# Patient Record
Sex: Male | Born: 1955 | Race: White | Hispanic: No | Marital: Married | Smoking: Never smoker
Health system: Southern US, Community
[De-identification: ages and names within clinical notes are randomized; demographics above are authoritative.]

## PROBLEM LIST (undated history)

## (undated) DIAGNOSIS — K219 Gastro-esophageal reflux disease without esophagitis: Secondary | ICD-10-CM

## (undated) DIAGNOSIS — Z860100 Personal history of colon polyps, unspecified: Secondary | ICD-10-CM

## (undated) DIAGNOSIS — N4231 Prostatic intraepithelial neoplasia: Secondary | ICD-10-CM

## (undated) DIAGNOSIS — M1990 Unspecified osteoarthritis, unspecified site: Secondary | ICD-10-CM

## (undated) DIAGNOSIS — N529 Male erectile dysfunction, unspecified: Secondary | ICD-10-CM

## (undated) DIAGNOSIS — J189 Pneumonia, unspecified organism: Secondary | ICD-10-CM

## (undated) DIAGNOSIS — N63 Unspecified lump in unspecified breast: Secondary | ICD-10-CM

## (undated) DIAGNOSIS — Z800 Family history of malignant neoplasm of digestive organs: Secondary | ICD-10-CM

## (undated) HISTORY — DX: Unspecified osteoarthritis, unspecified site: M19.90

## (undated) HISTORY — DX: Personal history of colonic polyps: Z86.010

## (undated) HISTORY — DX: Family history of malignant neoplasm of digestive organs: Z80.0

## (undated) HISTORY — PX: HERNIA REPAIR: SHX51

## (undated) HISTORY — DX: Unspecified lump in unspecified breast: N63.0

## (undated) HISTORY — DX: Prostatic intraepithelial neoplasia: N42.31

## (undated) HISTORY — DX: Personal history of colon polyps, unspecified: Z86.0100

## (undated) HISTORY — DX: Gastro-esophageal reflux disease without esophagitis: K21.9

## (undated) HISTORY — DX: Male erectile dysfunction, unspecified: N52.9

## (undated) HISTORY — DX: Pneumonia, unspecified organism: J18.9

---
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
## 2000-10-22 HISTORY — PX: HAND SURGERY: SHX662

## 2006-10-22 LAB — HM COLONOSCOPY

## 2009-01-26 DIAGNOSIS — E039 Hypothyroidism, unspecified: Secondary | ICD-10-CM

## 2009-01-26 DIAGNOSIS — E7800 Pure hypercholesterolemia, unspecified: Secondary | ICD-10-CM

## 2009-01-26 DIAGNOSIS — J309 Allergic rhinitis, unspecified: Secondary | ICD-10-CM

## 2009-01-26 DIAGNOSIS — K219 Gastro-esophageal reflux disease without esophagitis: Secondary | ICD-10-CM

## 2009-01-26 DIAGNOSIS — Z86010 Personal history of colonic polyps: Secondary | ICD-10-CM | POA: Insufficient documentation

## 2009-01-26 DIAGNOSIS — K449 Diaphragmatic hernia without obstruction or gangrene: Secondary | ICD-10-CM

## 2009-01-26 DIAGNOSIS — Z860101 Personal history of adenomatous and serrated colon polyps: Secondary | ICD-10-CM | POA: Insufficient documentation

## 2009-10-22 HISTORY — PX: UMBILICAL HERNIA REPAIR: SHX196

## 2010-02-24 ENCOUNTER — Ambulatory Visit: Payer: Self-pay | Admitting: General Surgery

## 2010-04-12 ENCOUNTER — Ambulatory Visit: Payer: Self-pay | Admitting: Gastroenterology

## 2010-04-12 LAB — HM COLONOSCOPY

## 2010-04-26 DIAGNOSIS — E559 Vitamin D deficiency, unspecified: Secondary | ICD-10-CM | POA: Insufficient documentation

## 2011-03-27 ENCOUNTER — Ambulatory Visit: Payer: Self-pay | Admitting: Family Medicine

## 2012-01-21 ENCOUNTER — Ambulatory Visit: Payer: Self-pay | Admitting: Family Medicine

## 2012-01-21 LAB — PSA: PSA: 2.2

## 2012-03-24 ENCOUNTER — Ambulatory Visit: Payer: Self-pay | Admitting: Family Medicine

## 2012-03-28 ENCOUNTER — Ambulatory Visit: Payer: Self-pay | Admitting: Family Medicine

## 2013-04-02 LAB — LIPID PANEL
CHOLESTEROL: 191 mg/dL (ref 0–200)
HDL: 55 mg/dL (ref 35–70)
LDL Cholesterol: 110 mg/dL
Triglycerides: 129 mg/dL (ref 40–160)

## 2013-04-02 LAB — BASIC METABOLIC PANEL
BUN: 15 mg/dL (ref 4–21)
Creatinine: 1 mg/dL (ref 0.6–1.3)
GLUCOSE: 99 mg/dL
POTASSIUM: 5.2 mmol/L (ref 3.4–5.3)
SODIUM: 142 mmol/L (ref 137–147)

## 2013-04-02 LAB — VITAMIN D 25 HYDROXY (VIT D DEFICIENCY, FRACTURES)
CK TOTAL: 133 U/L (ref ?–195.0)
Vitamin D 1, 25 (OH)2 Total: 33.1

## 2013-04-02 LAB — CBC AND DIFFERENTIAL
HCT: 47 % (ref 41–53)
Hemoglobin: 15.7 g/dL (ref 13.5–17.5)
PLATELETS: 300 10*3/uL (ref 150–399)
WBC: 6.3 10^3/mL

## 2013-04-02 LAB — TSH: TSH: 2.1 u[IU]/mL (ref 0.41–5.90)

## 2014-04-27 LAB — VITAMIN D 25 HYDROXY (VIT D DEFICIENCY, FRACTURES): Vitamin D 1, 25 (OH)2 Total: 35.5

## 2014-04-27 LAB — LIPID PANEL
Cholesterol: 186 mg/dL (ref 0–200)
HDL: 49 mg/dL (ref 35–70)
LDL Cholesterol: 103 mg/dL
Triglycerides: 172 mg/dL — AB (ref 40–160)

## 2014-04-27 LAB — BASIC METABOLIC PANEL
BUN: 13 mg/dL (ref 4–21)
Creatinine: 1 mg/dL (ref 0.6–1.3)
Glucose: 96 mg/dL
Potassium: 5.2 mmol/L (ref 3.4–5.3)
Sodium: 139 mmol/L (ref 137–147)

## 2014-04-27 LAB — TSH: TSH: 0.89 u[IU]/mL (ref 0.41–5.90)

## 2015-03-02 ENCOUNTER — Other Ambulatory Visit: Payer: Self-pay | Admitting: *Deleted

## 2015-03-02 ENCOUNTER — Encounter: Payer: Self-pay | Admitting: *Deleted

## 2015-03-02 DIAGNOSIS — Z1330 Encounter for screening examination for mental health and behavioral disorders, unspecified: Secondary | ICD-10-CM | POA: Insufficient documentation

## 2015-03-02 DIAGNOSIS — Z1342 Encounter for screening for global developmental delays (milestones): Secondary | ICD-10-CM | POA: Insufficient documentation

## 2015-03-02 DIAGNOSIS — J189 Pneumonia, unspecified organism: Secondary | ICD-10-CM

## 2015-03-02 DIAGNOSIS — IMO0001 Reserved for inherently not codable concepts without codable children: Secondary | ICD-10-CM | POA: Insufficient documentation

## 2015-03-02 DIAGNOSIS — R252 Cramp and spasm: Secondary | ICD-10-CM | POA: Insufficient documentation

## 2015-03-02 DIAGNOSIS — N4231 Prostatic intraepithelial neoplasia: Secondary | ICD-10-CM | POA: Insufficient documentation

## 2015-03-02 DIAGNOSIS — R030 Elevated blood-pressure reading, without diagnosis of hypertension: Secondary | ICD-10-CM

## 2015-03-02 DIAGNOSIS — Z800 Family history of malignant neoplasm of digestive organs: Secondary | ICD-10-CM | POA: Insufficient documentation

## 2015-03-02 DIAGNOSIS — N529 Male erectile dysfunction, unspecified: Secondary | ICD-10-CM | POA: Insufficient documentation

## 2015-03-02 DIAGNOSIS — N63 Unspecified lump in unspecified breast: Secondary | ICD-10-CM | POA: Insufficient documentation

## 2015-03-02 HISTORY — DX: Pneumonia, unspecified organism: J18.9

## 2015-03-03 ENCOUNTER — Encounter: Payer: Self-pay | Admitting: *Deleted

## 2015-03-04 ENCOUNTER — Encounter: Payer: Self-pay | Admitting: Family Medicine

## 2015-04-13 ENCOUNTER — Encounter: Payer: Self-pay | Admitting: Family Medicine

## 2015-04-13 ENCOUNTER — Ambulatory Visit (INDEPENDENT_AMBULATORY_CARE_PROVIDER_SITE_OTHER): Payer: 59 | Admitting: Family Medicine

## 2015-04-13 VITALS — BP 112/74 | HR 93 | Temp 98.0°F | Resp 16 | Ht 74.0 in | Wt 243.0 lb

## 2015-04-13 DIAGNOSIS — E559 Vitamin D deficiency, unspecified: Secondary | ICD-10-CM

## 2015-04-13 DIAGNOSIS — Z860100 Personal history of colon polyps, unspecified: Secondary | ICD-10-CM

## 2015-04-13 DIAGNOSIS — E7800 Pure hypercholesterolemia, unspecified: Secondary | ICD-10-CM

## 2015-04-13 DIAGNOSIS — Z0000 Encounter for general adult medical examination without abnormal findings: Secondary | ICD-10-CM

## 2015-04-13 DIAGNOSIS — E039 Hypothyroidism, unspecified: Secondary | ICD-10-CM | POA: Diagnosis not present

## 2015-04-13 DIAGNOSIS — H109 Unspecified conjunctivitis: Secondary | ICD-10-CM

## 2015-04-13 MED ORDER — CIPROFLOXACIN HCL 0.3 % OP SOLN: 2.0000 [drp] | OPHTHALMIC | Status: AC
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
## 2015-04-27 ENCOUNTER — Telehealth: Payer: Self-pay

## 2015-04-27 ENCOUNTER — Other Ambulatory Visit: Payer: Self-pay

## 2015-04-27 DIAGNOSIS — E7800 Pure hypercholesterolemia, unspecified: Secondary | ICD-10-CM

## 2015-04-27 LAB — LIPID PANEL
Chol/HDL Ratio: 3.8 ratio units (ref 0.0–5.0)
Cholesterol, Total: 206 mg/dL — ABNORMAL HIGH (ref 100–199)
HDL: 54 mg/dL (ref >39)
LDL Calculated: 114 mg/dL — ABNORMAL HIGH (ref 0–99)
Triglycerides: 190 mg/dL — ABNORMAL HIGH (ref 0–149)
VLDL Cholesterol Cal: 38 mg/dL (ref 5–40)

## 2015-04-27 LAB — COMPREHENSIVE METABOLIC PANEL
ALT: 20 IU/L (ref 0–44)
AST: 23 IU/L (ref 0–40)
Albumin/Globulin Ratio: 2.1 (ref 1.1–2.5)
Albumin: 4.4 g/dL (ref 3.5–5.5)
Alkaline Phosphatase: 48 IU/L (ref 39–117)
BUN/Creatinine Ratio: 13 (ref 9–20)
BUN: 14 mg/dL (ref 6–24)
Bilirubin Total: 0.3 mg/dL (ref 0.0–1.2)
CALCIUM: 9.3 mg/dL (ref 8.7–10.2)
CHLORIDE: 101 mmol/L (ref 97–108)
CO2: 25 mmol/L (ref 18–29)
Creatinine, Ser: 1.08 mg/dL (ref 0.76–1.27)
GFR calc Af Amer: 86 mL/min/{1.73_m2} (ref >59)
GFR, EST NON AFRICAN AMERICAN: 75 mL/min/{1.73_m2} (ref >59)
GLUCOSE: 100 mg/dL — AB (ref 65–99)
Globulin, Total: 2.1 g/dL (ref 1.5–4.5)
Potassium: 4.6 mmol/L (ref 3.5–5.2)
SODIUM: 140 mmol/L (ref 134–144)
Total Protein: 6.5 g/dL (ref 6.0–8.5)

## 2015-04-27 LAB — PSA: Prostate Specific Ag, Serum: 1.9 ng/mL (ref 0.0–4.0)

## 2015-04-27 LAB — TSH: TSH: 0.77 u[IU]/mL (ref 0.450–4.500)

## 2015-04-27 LAB — VITAMIN D 25 HYDROXY (VIT D DEFICIENCY, FRACTURES): VIT D 25 HYDROXY: 32.3 ng/mL (ref 30.0–100.0)

## 2015-04-27 MED ORDER — PRAVASTATIN SODIUM 80 MG PO TABS: 80.0000 mg | ORAL_TABLET | Freq: Every day | ORAL | Status: DC

## 2015-04-27 NOTE — Telephone Encounter (Signed)
Patient advised of lab results. Patient states he needs a refill on Pravastatin. Refill Request has been sent  And is attached in this message.

## 2015-04-27 NOTE — Telephone Encounter (Signed)
Gastroenterology Pre-Procedure Review  Request Date: 06-21-15 Requesting Physician: Dr. Caryn Section  PATIENT REVIEW QUESTIONS: The patient responded to the following health history questions as indicated:    1. Are you having any GI issues? no 2. Do you have a personal history of Polyps? yes (last one 5 years ago didn't removed. Some in the past) 3. Do you have a family history of Colon Cancer or Polyps? yes (Paternal grandmother) 36. Diabetes Mellitus? no 5. Joint replacements in the past 12 months?no 6. Major health problems in the past 3 months?no 7. Any artificial heart valves, MVP, or defibrillator?no    MEDICATIONS & ALLERGIES:    Patient reports the following regarding taking any anticoagulation/antiplatelet therapy:   Plavix, Coumadin, Eliquis, Xarelto, Lovenox, Pradaxa, Brilinta, or Effient? no Aspirin? yes (81mg )  Patient confirms/reports the following medications:  Current Outpatient Prescriptions  Medication Sig Dispense Refill  . aspirin 81 MG tablet Take 1 tablet by mouth daily.    . cholecalciferol (VITAMIN D) 1000 UNITS tablet Take 1 tablet by mouth daily.    . fexofenadine (ALLEGRA) 180 MG tablet Take 1 tablet by mouth daily.    . finasteride (PROSCAR) 5 MG tablet Take 1 tablet by mouth daily.    Marland Kitchen levothyroxine (SYNTHROID, LEVOTHROID) 200 MCG tablet Take 1 tablet by mouth daily.    . pantoprazole (PROTONIX) 40 MG tablet Take 1 tablet by mouth daily.    . pravastatin (PRAVACHOL) 80 MG tablet Take 1 tablet by mouth daily.    . sildenafil (VIAGRA) 100 MG tablet Take 1 tablet by mouth daily as needed.    . triamcinolone (NASACORT) 55 MCG/ACT AERO nasal inhaler Place 1 spray into the nose as needed. Each nostril     No current facility-administered medications for this visit.    Patient confirms/reports the following allergies:  No Known Allergies  No orders of the defined types were placed in this encounter.    AUTHORIZATION INFORMATION Primary  Insurance: 1D#: Group #:  Secondary Insurance: 1D#: Group #:  SCHEDULE INFORMATION: Date: 06-21-15 Time: Location: [facility]

## 2015-04-27 NOTE — Telephone Encounter (Signed)
-----   Message from Birdie Sons, MD sent at 04/27/2015  1:34 PM EDT ----- Labs are all good. Continue current medications.

## 2015-06-21 ENCOUNTER — Ambulatory Visit: Payer: 59 | Admitting: Anesthesiology

## 2015-06-21 ENCOUNTER — Encounter: Payer: Self-pay | Admitting: *Deleted

## 2015-06-21 ENCOUNTER — Ambulatory Visit
Admission: RE | Admit: 2015-06-21 | Discharge: 2015-06-21 | Disposition: A | Discharge status: Home / Self Care | Payer: 59 | Source: Ambulatory Visit | Attending: Gastroenterology | Admitting: Gastroenterology

## 2015-06-21 ENCOUNTER — Encounter
Admission: RE | Disposition: A | Discharge status: Home / Self Care | Payer: Self-pay | Source: Ambulatory Visit | Attending: Gastroenterology

## 2015-06-21 DIAGNOSIS — Z86010 Personal history of colonic polyps: Secondary | ICD-10-CM | POA: Insufficient documentation

## 2015-06-21 DIAGNOSIS — Z7982 Long term (current) use of aspirin: Secondary | ICD-10-CM | POA: Insufficient documentation

## 2015-06-21 DIAGNOSIS — Z79899 Other long term (current) drug therapy: Secondary | ICD-10-CM | POA: Diagnosis not present

## 2015-06-21 DIAGNOSIS — E039 Hypothyroidism, unspecified: Secondary | ICD-10-CM | POA: Insufficient documentation

## 2015-06-21 DIAGNOSIS — D123 Benign neoplasm of transverse colon: Secondary | ICD-10-CM | POA: Diagnosis not present

## 2015-06-21 DIAGNOSIS — D120 Benign neoplasm of cecum: Secondary | ICD-10-CM | POA: Insufficient documentation

## 2015-06-21 DIAGNOSIS — D122 Benign neoplasm of ascending colon: Secondary | ICD-10-CM | POA: Diagnosis not present

## 2015-06-21 DIAGNOSIS — N529 Male erectile dysfunction, unspecified: Secondary | ICD-10-CM | POA: Insufficient documentation

## 2015-06-21 HISTORY — PX: COLONOSCOPY WITH PROPOFOL: SHX5780

## 2015-06-21 SURGERY — COLONOSCOPY WITH PROPOFOL
Anesthesia: General

## 2015-06-21 MED ORDER — PHENYLEPHRINE HCL 10 MG/ML IJ SOLN
INTRAMUSCULAR | Status: DC | PRN
  Administered 2015-06-21: 100 ug via INTRAVENOUS

## 2015-06-21 MED ORDER — LIDOCAINE HCL (CARDIAC) 20 MG/ML IV SOLN
INTRAVENOUS | Status: DC | PRN
  Administered 2015-06-21: 60 mg via INTRAVENOUS

## 2015-06-21 MED ORDER — PROPOFOL 10 MG/ML IV BOLUS
INTRAVENOUS | Status: DC | PRN
  Administered 2015-06-21: 70 mg via INTRAVENOUS

## 2015-06-21 MED ORDER — PROPOFOL INFUSION 10 MG/ML OPTIME
INTRAVENOUS | Status: DC | PRN
  Administered 2015-06-21: 200 ug/kg/min via INTRAVENOUS

## 2015-06-21 MED ORDER — SODIUM CHLORIDE 0.9 % IV SOLN
INTRAVENOUS | Status: DC
  Administered 2015-06-21: 09:00:00 via INTRAVENOUS

## 2015-06-21 NOTE — Anesthesia Postprocedure Evaluation (Signed)
  Anesthesia Post-op Note  Patient: Ronnie [name]  Procedure(s) Performed: Procedure(s): COLONOSCOPY WITH PROPOFOL (N/A)  Anesthesia type:General  Patient location: PACU  Post pain: Pain level controlled  Post assessment: Post-op Vital signs reviewed, Patient's Cardiovascular Status Stable, Respiratory Function Stable, Patent Airway and No signs of Nausea or vomiting  Post vital signs: Reviewed and stable  Last Vitals:  Filed Vitals:   06/21/15 1019  BP: 98/56  Pulse: 68  Temp: 36.3 C  Resp: 15    Level of consciousness: awake, alert  and patient cooperative  Complications: No apparent anesthesia complications

## 2015-06-21 NOTE — Op Note (Signed)
[facility] Patient Name: Ronnie [name] Procedure Date: 06/21/2015 9:46 AM MRN: [id number] Account #: [id number] Date of Birth: 04/28/1956 Admit Type: Inpatient Age: 59 Room: [facility] ENDO ROOM 4 Gender: Male Note Status: Finalized Procedure:         Colonoscopy Indications:       High risk colon cancer surveillance: Personal history of                     colonic polyps Providers:         Lucilla Lame, MD Referring MD:      Kirstie Peri. Caryn Section, MD (Referring MD) Medicines:         Propofol per Anesthesia Complications:     No immediate complications. Procedure:         Pre-Anesthesia Assessment:                    - Prior to the procedure, a History and Physical was                     performed, and patient medications and allergies were                     reviewed. The patient's tolerance of previous anesthesia                     was also reviewed. The risks and benefits of the procedure                     and the sedation options and risks were discussed with the                     patient. All questions were answered, and informed consent                     was obtained. Prior Anticoagulants: The patient has taken                     no previous anticoagulant or antiplatelet agents. ASA                     Grade Assessment: II - A patient with mild systemic                     disease. After reviewing the risks and benefits, the                     patient was deemed in satisfactory condition to undergo                     the procedure.                    After obtaining informed consent, the colonoscope was                     passed under direct vision. Throughout the procedure, the                     patient's blood pressure, pulse, and oxygen saturations                     were monitored continuously. The Colonoscope was  introduced through the anus and advanced to the the cecum,                     identified by  appendiceal orifice and ileocecal valve. The                     colonoscopy was performed without difficulty. The patient                     tolerated the procedure well. The quality of the bowel                     preparation was excellent. Findings:      The perianal and digital rectal examinations were normal.      Two sessile polyps were found in the cecum. The polyps were 4 to 6 mm in       size. These polyps were removed with a cold snare. Resection and       retrieval were complete.      Three sessile polyps were found in the ascending colon. The polyps were       4 to 7 mm in size. These polyps were removed with a cold snare.       Resection and retrieval were complete.      A 2 mm polyp was found in the ascending colon. The polyp was sessile.       The polyp was removed with a cold biopsy forceps. Resection and       retrieval were complete.      Two sessile polyps were found in the transverse colon. The polyps were 5       to 7 mm in size. These polyps were removed with a cold snare. Resection       and retrieval were complete. Impression:        - Two 4 to 6 mm polyps in the cecum. Resected and                     retrieved.                    - Three 4 to 7 mm polyps in the ascending colon. Resected                     and retrieved.                    - One 2 mm polyp in the ascending colon. Resected and                     retrieved.                    - Two 5 to 7 mm polyps in the transverse colon. Resected                     and retrieved. Recommendation:    - Await pathology results.                    - Repeat colonoscopy in 3 years for surveillance. Procedure Code(s): --- Professional ---                    [phone], Colonoscopy, flexible; with removal of tumor(s),  polyp(s), or other lesion(s) by snare technique                    45380, 59, Colonoscopy, flexible; with biopsy, single or                     multiple Diagnosis Code(s): ---  Professional ---                    Z86.010, Personal history of colonic polyps                    D12.0, Benign neoplasm of cecum                    D12.2, Benign neoplasm of ascending colon                    D12.3, Benign neoplasm of transverse colon CPT copyright 2014 American Medical Association. All rights reserved. The codes documented in this report are preliminary and upon coder review may  be revised to meet current compliance requirements. Lucilla Lame, MD 06/21/2015 10:15:22 AM This report has been signed electronically. Number of Addenda: 0 Note Initiated On: 06/21/2015 9:46 AM Scope Withdrawal Time: 0 hours 12 minutes 58 seconds  Total Procedure Duration: 0 hours 15 minutes 33 seconds       [facility]

## 2015-06-21 NOTE — Transfer of Care (Signed)
Immediate Anesthesia Transfer of Care Note  Patient: Ronnie [name]  Procedure(s) Performed: Procedure(s): COLONOSCOPY WITH PROPOFOL (N/A)  Patient Location: PACU  Anesthesia Type:General  Level of Consciousness: awake  Airway & Oxygen Therapy: Patient Spontanous Breathing and Patient connected to nasal cannula oxygen  Post-op Assessment: Report given to RN and Post -op Vital signs reviewed and stable  Post vital signs: Reviewed and stable  Last Vitals:  Filed Vitals:   06/21/15 1019  BP: 98/56  Pulse: 68  Temp: 36.3 C  Resp: 15    Complications: No apparent anesthesia complications
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
## 2015-06-21 NOTE — Anesthesia Preprocedure Evaluation (Signed)
Anesthesia Evaluation  Patient identified by MRN, date of birth, ID band Patient awake    Reviewed: Allergy & Precautions, NPO status , Patient's Chart, lab work & pertinent test results  History of Anesthesia Complications Negative for: history of anesthetic complications  Airway Mallampati: II       Dental  (+) Caps   Pulmonary neg pulmonary ROS,    Pulmonary exam normal       Cardiovascular negative cardio ROS  Rate:Tachycardia     Neuro/Psych negative neurological ROS  negative psych ROS   GI/Hepatic Neg liver ROS, GERD-  ,  Endo/Other  Hypothyroidism   Renal/GU negative Renal ROS     Musculoskeletal negative musculoskeletal ROS (+)   Abdominal Normal abdominal exam  (+)   Peds negative pediatric ROS (+)  Hematology negative hematology ROS (+)   Anesthesia Other Findings   Reproductive/Obstetrics negative OB ROS                             Anesthesia Physical Anesthesia Plan  ASA: II  Anesthesia Plan: General   Post-op Pain Management:    Induction: Intravenous  Airway Management Planned: Nasal Cannula  Additional Equipment:   Intra-op Plan:   Post-operative Plan:   Informed Consent: I have reviewed the patients History and Physical, chart, labs and discussed the procedure including the risks, benefits and alternatives for the proposed anesthesia with the patient or authorized representative who has indicated his/her understanding and acceptance.     Plan Discussed with: CRNA  Anesthesia Plan Comments:         Anesthesia Quick Evaluation

## 2015-06-22 LAB — SURGICAL PATHOLOGY

## 2015-06-23 ENCOUNTER — Encounter: Payer: Self-pay | Admitting: Gastroenterology

## 2015-06-29 ENCOUNTER — Other Ambulatory Visit: Payer: Self-pay | Admitting: Family Medicine

## 2015-06-29 NOTE — Telephone Encounter (Signed)
Pt contacted office for refill request on the following medications: pantoprazole (PROTONIX) 40 MG tablet to [facility] in [place]. Thanks TNP

## 2015-07-04 ENCOUNTER — Other Ambulatory Visit: Payer: Self-pay | Admitting: Family Medicine

## 2016-03-16 ENCOUNTER — Other Ambulatory Visit: Payer: Self-pay | Admitting: Family Medicine

## 2016-07-09 ENCOUNTER — Other Ambulatory Visit: Payer: Self-pay | Admitting: Family Medicine

## 2016-07-15 ENCOUNTER — Other Ambulatory Visit: Payer: Self-pay | Admitting: Family Medicine

## 2016-07-31 ENCOUNTER — Encounter: Payer: Self-pay | Admitting: Family Medicine

## 2016-07-31 ENCOUNTER — Ambulatory Visit (INDEPENDENT_AMBULATORY_CARE_PROVIDER_SITE_OTHER): Payer: Managed Care, Other (non HMO) | Admitting: Family Medicine

## 2016-07-31 VITALS — BP 140/88 | HR 88 | Temp 98.2°F | Resp 16 | Ht 73.5 in | Wt 232.0 lb

## 2016-07-31 DIAGNOSIS — Z0000 Encounter for general adult medical examination without abnormal findings: Secondary | ICD-10-CM | POA: Diagnosis not present

## 2016-07-31 DIAGNOSIS — E559 Vitamin D deficiency, unspecified: Secondary | ICD-10-CM | POA: Diagnosis not present

## 2016-07-31 DIAGNOSIS — Z2911 Encounter for prophylactic immunotherapy for respiratory syncytial virus (RSV): Secondary | ICD-10-CM | POA: Diagnosis not present

## 2016-07-31 DIAGNOSIS — E7800 Pure hypercholesterolemia, unspecified: Secondary | ICD-10-CM | POA: Diagnosis not present

## 2016-07-31 DIAGNOSIS — E039 Hypothyroidism, unspecified: Secondary | ICD-10-CM

## 2016-07-31 DIAGNOSIS — Z23 Encounter for immunization: Secondary | ICD-10-CM

## 2016-07-31 DIAGNOSIS — Z125 Encounter for screening for malignant neoplasm of prostate: Secondary | ICD-10-CM | POA: Diagnosis not present
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
## 2016-09-07 ENCOUNTER — Telehealth: Payer: Self-pay

## 2016-09-07 LAB — COMPREHENSIVE METABOLIC PANEL
A/G RATIO: 1.7 (ref 1.2–2.2)
ALK PHOS: 51 IU/L (ref 39–117)
ALT: 17 IU/L (ref 0–44)
AST: 20 IU/L (ref 0–40)
Albumin: 4.2 g/dL (ref 3.6–4.8)
BUN/Creatinine Ratio: 14 (ref 10–24)
BUN: 15 mg/dL (ref 8–27)
Bilirubin Total: 0.6 mg/dL (ref 0.0–1.2)
CO2: 25 mmol/L (ref 18–29)
Calcium: 9.8 mg/dL (ref 8.6–10.2)
Chloride: 101 mmol/L (ref 96–106)
Creatinine, Ser: 1.05 mg/dL (ref 0.76–1.27)
GFR calc Af Amer: 88 mL/min/{1.73_m2} (ref >59)
GFR calc non Af Amer: 76 mL/min/{1.73_m2} (ref >59)
GLOBULIN, TOTAL: 2.5 g/dL (ref 1.5–4.5)
Glucose: 96 mg/dL (ref 65–99)
POTASSIUM: 5.2 mmol/L (ref 3.5–5.2)
SODIUM: 141 mmol/L (ref 134–144)
Total Protein: 6.7 g/dL (ref 6.0–8.5)

## 2016-09-07 LAB — TSH: TSH: 1.29 u[IU]/mL (ref 0.450–4.500)

## 2016-09-07 LAB — LIPID PANEL
CHOL/HDL RATIO: 3.6 ratio (ref 0.0–5.0)
Cholesterol, Total: 182 mg/dL (ref 100–199)
HDL: 51 mg/dL (ref >39)
LDL CALC: 110 mg/dL — AB (ref 0–99)
TRIGLYCERIDES: 107 mg/dL (ref 0–149)
VLDL CHOLESTEROL CAL: 21 mg/dL (ref 5–40)

## 2016-09-07 LAB — PSA: Prostate Specific Ag, Serum: 1.9 ng/mL (ref 0.0–4.0)

## 2016-09-07 LAB — VITAMIN D 25 HYDROXY (VIT D DEFICIENCY, FRACTURES): VIT D 25 HYDROXY: 37.6 ng/mL (ref 30.0–100.0)

## 2016-09-07 NOTE — Telephone Encounter (Signed)
-----   Message from Birdie Sons, MD sent at 09/07/2016  7:52 AM EST ----- PSA, blood sugar, kidney functions, electrolytes and cholesterol are all normal. Continue current medications.  Check labs yearly.

## 2016-09-07 NOTE — Telephone Encounter (Signed)
Patient was notified of results. Expressed understanding.  

## 2016-09-07 NOTE — Telephone Encounter (Signed)
Left message to call back  

## 2016-10-28 ENCOUNTER — Other Ambulatory Visit: Payer: Self-pay | Admitting: Family Medicine

## 2016-11-11 DIAGNOSIS — J209 Acute bronchitis, unspecified: Secondary | ICD-10-CM | POA: Diagnosis not present

## 2017-01-08 ENCOUNTER — Other Ambulatory Visit: Payer: Self-pay | Admitting: Family Medicine

## 2017-03-07 ENCOUNTER — Other Ambulatory Visit: Payer: Self-pay | Admitting: Family Medicine

## 2017-04-01 DIAGNOSIS — M7541 Impingement syndrome of right shoulder: Secondary | ICD-10-CM | POA: Diagnosis not present

## 2017-04-01 DIAGNOSIS — M7542 Impingement syndrome of left shoulder: Secondary | ICD-10-CM | POA: Diagnosis not present

## 2017-04-04 DIAGNOSIS — L259 Unspecified contact dermatitis, unspecified cause: Secondary | ICD-10-CM | POA: Diagnosis not present

## 2017-04-16 DIAGNOSIS — M7542 Impingement syndrome of left shoulder: Secondary | ICD-10-CM | POA: Diagnosis not present

## 2017-04-16 DIAGNOSIS — M7541 Impingement syndrome of right shoulder: Secondary | ICD-10-CM | POA: Diagnosis not present

## 2017-05-06 ENCOUNTER — Other Ambulatory Visit: Payer: Self-pay | Admitting: Family Medicine

## 2017-05-13 DIAGNOSIS — Z86018 Personal history of other benign neoplasm: Secondary | ICD-10-CM | POA: Diagnosis not present

## 2017-05-13 DIAGNOSIS — L821 Other seborrheic keratosis: Secondary | ICD-10-CM | POA: Diagnosis not present

## 2017-05-13 DIAGNOSIS — D485 Neoplasm of uncertain behavior of skin: Secondary | ICD-10-CM | POA: Diagnosis not present

## 2017-06-06 DIAGNOSIS — M25511 Pain in right shoulder: Secondary | ICD-10-CM | POA: Diagnosis not present

## 2017-06-07 DIAGNOSIS — M7541 Impingement syndrome of right shoulder: Secondary | ICD-10-CM | POA: Diagnosis not present

## 2017-06-10 ENCOUNTER — Other Ambulatory Visit: Payer: Self-pay | Admitting: Orthopedic Surgery

## 2017-06-10 DIAGNOSIS — M25511 Pain in right shoulder: Secondary | ICD-10-CM

## 2017-06-14 ENCOUNTER — Ambulatory Visit
Admission: RE | Admit: 2017-06-14 | Discharge: 2017-06-14 | Disposition: A | Discharge status: Home / Self Care | Payer: 59 | Source: Ambulatory Visit | Attending: Orthopedic Surgery | Admitting: Orthopedic Surgery

## 2017-06-14 DIAGNOSIS — M7581 Other shoulder lesions, right shoulder: Secondary | ICD-10-CM | POA: Diagnosis not present

## 2017-06-14 DIAGNOSIS — M25511 Pain in right shoulder: Secondary | ICD-10-CM | POA: Insufficient documentation

## 2017-06-14 DIAGNOSIS — M19011 Primary osteoarthritis, right shoulder: Secondary | ICD-10-CM | POA: Insufficient documentation

## 2017-06-14 MED ORDER — LIDOCAINE HCL (PF) 1 % IJ SOLN
5.0000 mL | Freq: Once | INTRAMUSCULAR | Status: AC
  Administered 2017-06-14: 5 mL
  Filled 2017-06-14: qty 5

## 2017-06-14 MED ORDER — IOPAMIDOL (ISOVUE-200) INJECTION 41%
50.0000 mL | Freq: Once | INTRAVENOUS | Status: AC | PRN
  Administered 2017-06-14: 10 mL via INTRAVENOUS
  Filled 2017-06-14: qty 50

## 2017-06-14 MED ORDER — GADOBENATE DIMEGLUMINE 529 MG/ML IV SOLN
0.1000 mL | Freq: Once | INTRAVENOUS | Status: AC | PRN
  Administered 2017-06-14: 0.1 mL via INTRA_ARTICULAR

## 2017-06-14 MED ORDER — SODIUM CHLORIDE 0.9 % IJ SOLN
20.0000 mL | INTRAMUSCULAR | Status: DC | PRN
  Administered 2017-06-14: 5 mL
  Filled 2017-06-14: qty 20

## 2017-06-18 DIAGNOSIS — M19011 Primary osteoarthritis, right shoulder: Secondary | ICD-10-CM | POA: Diagnosis not present

## 2017-06-18 DIAGNOSIS — M7541 Impingement syndrome of right shoulder: Secondary | ICD-10-CM | POA: Diagnosis not present

## 2017-07-05 ENCOUNTER — Other Ambulatory Visit: Payer: Self-pay | Admitting: Orthopedic Surgery

## 2017-07-05 DIAGNOSIS — M7542 Impingement syndrome of left shoulder: Principal | ICD-10-CM

## 2017-07-05 DIAGNOSIS — M7541 Impingement syndrome of right shoulder: Secondary | ICD-10-CM

## 2017-07-10 ENCOUNTER — Other Ambulatory Visit: Payer: Self-pay | Admitting: Family Medicine

## 2017-07-15 ENCOUNTER — Ambulatory Visit
Admission: RE | Admit: 2017-07-15 | Discharge: 2017-07-15 | Disposition: A | Discharge status: Home / Self Care | Payer: 59 | Source: Ambulatory Visit | Attending: Orthopedic Surgery | Admitting: Orthopedic Surgery

## 2017-07-15 ENCOUNTER — Other Ambulatory Visit: Payer: Self-pay | Admitting: Orthopedic Surgery

## 2017-07-15 DIAGNOSIS — M7541 Impingement syndrome of right shoulder: Secondary | ICD-10-CM | POA: Diagnosis not present

## 2017-07-15 DIAGNOSIS — M19011 Primary osteoarthritis, right shoulder: Secondary | ICD-10-CM | POA: Diagnosis not present

## 2017-07-15 DIAGNOSIS — M7542 Impingement syndrome of left shoulder: Secondary | ICD-10-CM

## 2017-07-15 MED ORDER — BUPIVACAINE HCL (PF) 0.25 % IJ SOLN
7.0000 mL | Freq: Once | INTRAMUSCULAR | Status: AC
  Administered 2017-07-15: 7 mL via INTRA_ARTICULAR

## 2017-07-15 MED ORDER — IOPAMIDOL (ISOVUE-200) INJECTION 41%
10.0000 mL | Freq: Once | INTRAVENOUS | Status: AC | PRN
  Administered 2017-07-15: 10 mL
  Filled 2017-07-15: qty 50

## 2017-07-15 MED ORDER — LIDOCAINE HCL (PF) 1 % IJ SOLN
5.0000 mL | Freq: Once | INTRAMUSCULAR | Status: AC
  Administered 2017-07-15: 5 mL

## 2017-07-15 MED ORDER — METHYLPREDNISOLONE ACETATE 40 MG/ML INJ SUSP (RADIOLOG
80.0000 mg | Freq: Once | INTRAMUSCULAR | Status: AC
  Administered 2017-07-15: 80 mg via INTRA_ARTICULAR

## 2017-07-15 MED ORDER — LIDOCAINE HCL (PF) 1 % IJ SOLN
5.0000 mL | Freq: Once | INTRAMUSCULAR | Status: AC
  Administered 2017-07-15: 5 mL
  Filled 2017-07-15: qty 5

## 2017-07-15 MED ORDER — BUPIVACAINE HCL (PF) 0.25 % IJ SOLN
INTRAMUSCULAR | Status: AC
  Filled 2017-07-15: qty 30

## 2017-07-15 MED ORDER — METHYLPREDNISOLONE ACETATE 80 MG/ML IJ SUSP
INTRAMUSCULAR | Status: AC
  Filled 2017-07-15: qty 2

## 2017-07-15 MED ORDER — BUPIVACAINE HCL (PF) 0.25 % IJ SOLN
7.0000 mL | Freq: Once | INTRAMUSCULAR | Status: AC
  Administered 2017-07-15: 7 mL via INTRA_ARTICULAR
  Filled 2017-07-15: qty 10

## 2017-08-07 ENCOUNTER — Encounter: Payer: Self-pay | Admitting: Family Medicine

## 2017-08-07 ENCOUNTER — Ambulatory Visit (INDEPENDENT_AMBULATORY_CARE_PROVIDER_SITE_OTHER): Payer: 59 | Admitting: Family Medicine

## 2017-08-07 VITALS — BP 130/80 | HR 86 | Temp 98.6°F | Resp 16 | Ht 74.0 in | Wt 235.0 lb

## 2017-08-07 DIAGNOSIS — E7800 Pure hypercholesterolemia, unspecified: Secondary | ICD-10-CM | POA: Diagnosis not present

## 2017-08-07 DIAGNOSIS — Z86010 Personal history of colonic polyps: Secondary | ICD-10-CM

## 2017-08-07 DIAGNOSIS — Z6830 Body mass index (BMI) 30.0-30.9, adult: Secondary | ICD-10-CM

## 2017-08-07 DIAGNOSIS — E559 Vitamin D deficiency, unspecified: Secondary | ICD-10-CM | POA: Diagnosis not present

## 2017-08-07 DIAGNOSIS — Z23 Encounter for immunization: Secondary | ICD-10-CM | POA: Diagnosis not present

## 2017-08-07 DIAGNOSIS — N4231 Prostatic intraepithelial neoplasia: Secondary | ICD-10-CM

## 2017-08-07 DIAGNOSIS — Z0000 Encounter for general adult medical examination without abnormal findings: Secondary | ICD-10-CM | POA: Diagnosis not present

## 2017-08-07 DIAGNOSIS — E039 Hypothyroidism, unspecified: Secondary | ICD-10-CM | POA: Diagnosis not present
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
## 2017-08-07 NOTE — Patient Instructions (Signed)

## 2017-08-21 LAB — LIPID PANEL
Cholesterol: 188 mg/dL (ref <200)
HDL: 54 mg/dL (ref >40)
LDL CHOLESTEROL (CALC): 110 mg/dL — AB
NON-HDL CHOLESTEROL (CALC): 134 mg/dL — AB (ref <130)
TRIGLYCERIDES: 127 mg/dL (ref <150)
Total CHOL/HDL Ratio: 3.5 (calc) (ref <5.0)

## 2017-08-21 LAB — COMPLETE METABOLIC PANEL WITH GFR
AG RATIO: 1.6 (calc) (ref 1.0–2.5)
ALT: 18 U/L (ref 9–46)
AST: 19 U/L (ref 10–35)
Albumin: 4.2 g/dL (ref 3.6–5.1)
Alkaline phosphatase (APISO): 46 U/L (ref 40–115)
BUN: 13 mg/dL (ref 7–25)
CALCIUM: 9.5 mg/dL (ref 8.6–10.3)
CO2: 26 mmol/L (ref 20–32)
CREATININE: 0.9 mg/dL (ref 0.70–1.25)
Chloride: 107 mmol/L (ref 98–110)
GFR, EST AFRICAN AMERICAN: 106 mL/min/{1.73_m2} (ref >=60)
GFR, EST NON AFRICAN AMERICAN: 91 mL/min/{1.73_m2} (ref >=60)
GLUCOSE: 100 mg/dL — AB (ref 65–99)
Globulin: 2.6 g/dL (calc) (ref 1.9–3.7)
POTASSIUM: 5.1 mmol/L (ref 3.5–5.3)
Sodium: 141 mmol/L (ref 135–146)
TOTAL PROTEIN: 6.8 g/dL (ref 6.1–8.1)
Total Bilirubin: 0.5 mg/dL (ref 0.2–1.2)

## 2017-08-21 LAB — VITAMIN D 25 HYDROXY (VIT D DEFICIENCY, FRACTURES): Vit D, 25-Hydroxy: 33 ng/mL (ref 30–100)

## 2017-08-21 LAB — PSA: PSA: 1.3 ng/mL (ref <=4.0)

## 2017-08-21 LAB — TSH: TSH: 1.57 m[IU]/L (ref 0.40–4.50)

## 2017-11-12 DIAGNOSIS — L570 Actinic keratosis: Secondary | ICD-10-CM | POA: Diagnosis not present

## 2017-11-12 DIAGNOSIS — Z86018 Personal history of other benign neoplasm: Secondary | ICD-10-CM | POA: Diagnosis not present

## 2017-11-12 DIAGNOSIS — L578 Other skin changes due to chronic exposure to nonionizing radiation: Secondary | ICD-10-CM | POA: Diagnosis not present

## 2017-11-13 ENCOUNTER — Other Ambulatory Visit: Payer: Self-pay | Admitting: Family Medicine

## 2017-11-28 DIAGNOSIS — R972 Elevated prostate specific antigen [PSA]: Secondary | ICD-10-CM | POA: Diagnosis not present

## 2017-11-28 DIAGNOSIS — N401 Enlarged prostate with lower urinary tract symptoms: Secondary | ICD-10-CM | POA: Diagnosis not present

## 2017-11-28 DIAGNOSIS — R339 Retention of urine, unspecified: Secondary | ICD-10-CM | POA: Diagnosis not present

## 2017-11-28 DIAGNOSIS — D075 Carcinoma in situ of prostate: Secondary | ICD-10-CM | POA: Diagnosis not present

## 2018-01-01 IMAGING — MR MR SHOULDER*R* W/CM
5 series · 40 of 40 positions shown · IV contrast (agent unspecified)
Comparison: Image from contrast injection review.

CLINICAL DATA: Right shoulder pain since an injury approximately 8
weeks ago while working out.

EXAM:
MR ARTHROGRAM OF THE RIGHT SHOULDER
TECHNIQUE: Multiplanar, multisequence MR imaging of the right shoulder was
performed following the administration of intra-articular contrast.
CONTRAST:  See Injection Documentation.

[Series 3: T1 fat-sat · axial · 4.0mm · 0.55mm/px · z∈[+32,+138]mm · 11 of 25 slices shown (1 of 3)]
[im 1/25]
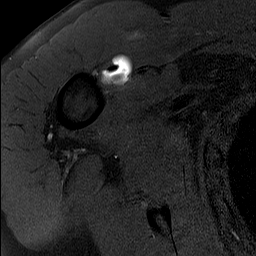
[im 3/25]
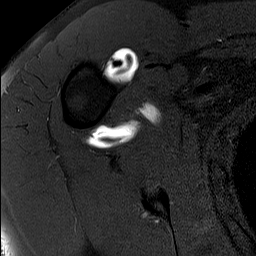
[im 5/25]
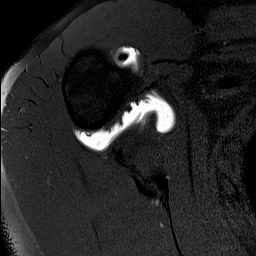
[im 8/25]
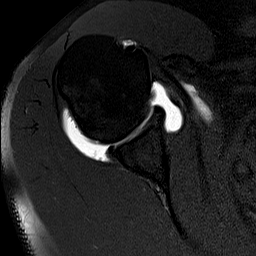
[im 10/25]
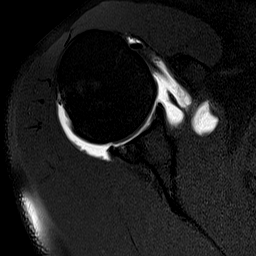
[im 13/25]
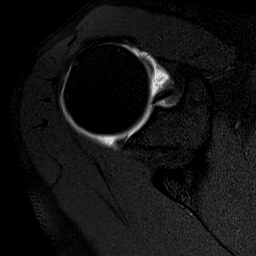
[im 15/25]
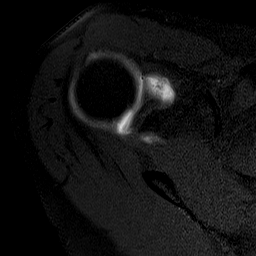
[im 17/25]
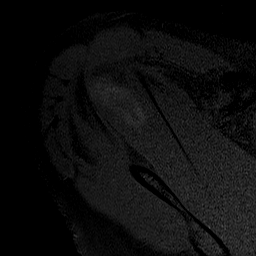
[im 20/25]
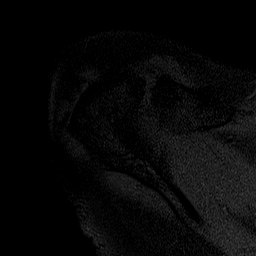
[im 22/25]
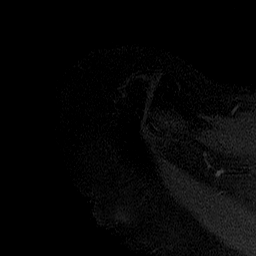
[im 25/25]
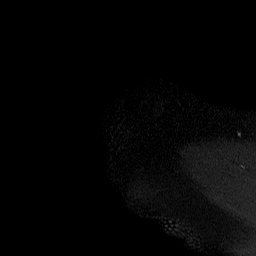

[Series 4: T1 fat-sat · oblique · 4.0mm · 0.62mm/px · 7 of 19 slices shown (2 of 3)]
[im 1/19]
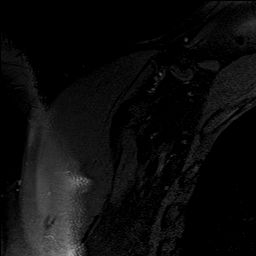
[im 4/19]
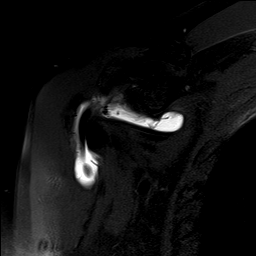
[im 7/19]
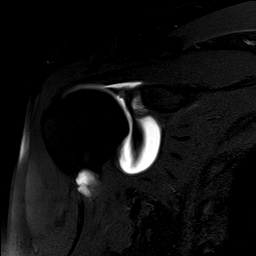
[im 10/19]
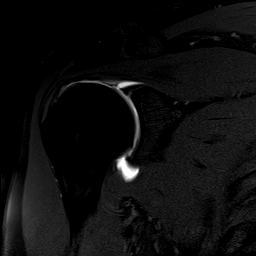
[im 13/19]
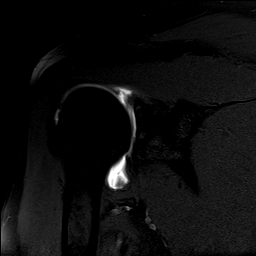
[im 16/19]
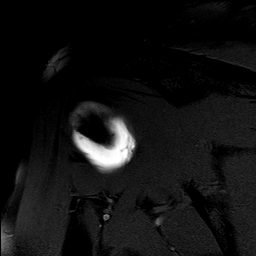
[im 19/19]
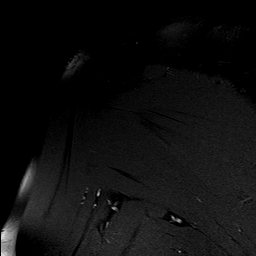

[Series 5: T2 fat-sat · oblique · 4.0mm · 0.62mm/px · 7 of 19 slices shown (1 of 2)]
[im 1/19]
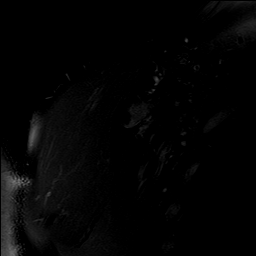
[im 4/19]
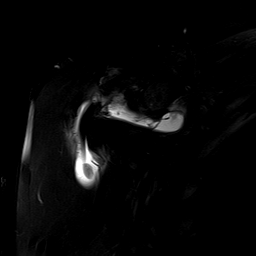
[im 7/19]
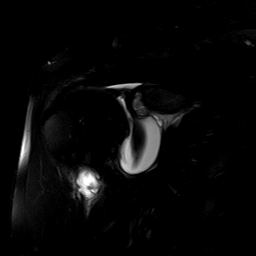
[im 10/19]
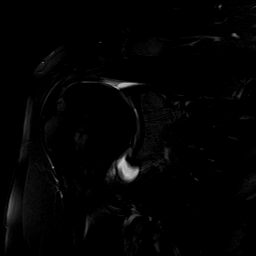
[im 13/19]
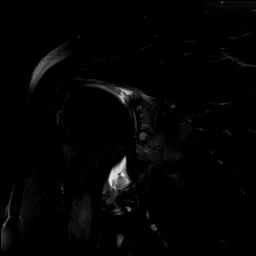
[im 16/19]
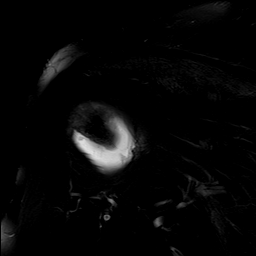
[im 19/19]
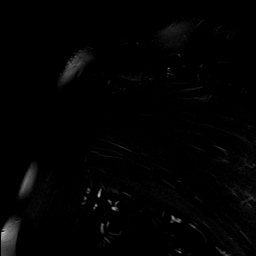

[Series 6: T1 fat-sat · oblique · non-contrast · 4.0mm · 0.42mm/px · 7 of 19 slices shown (3 of 3)]
[im 1/19]
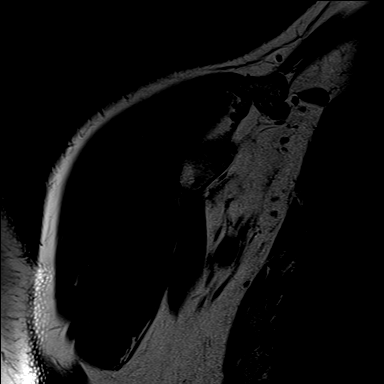
[im 4/19]
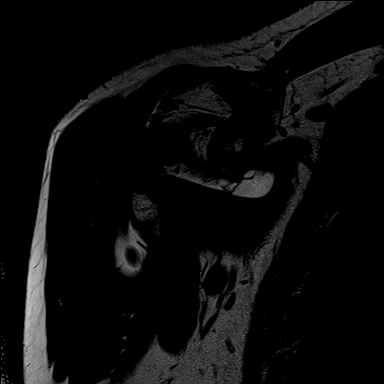
[im 7/19]
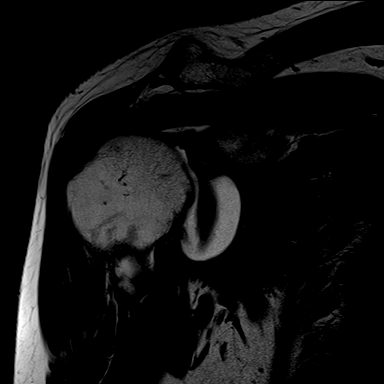
[im 10/19]
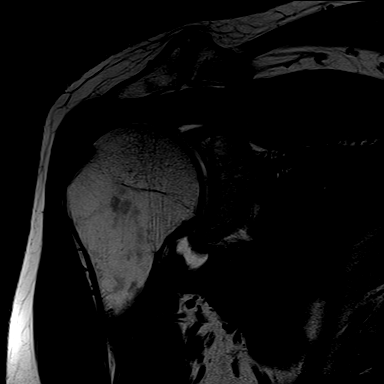
[im 13/19]
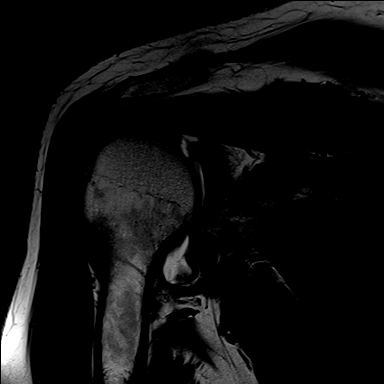
[im 16/19]
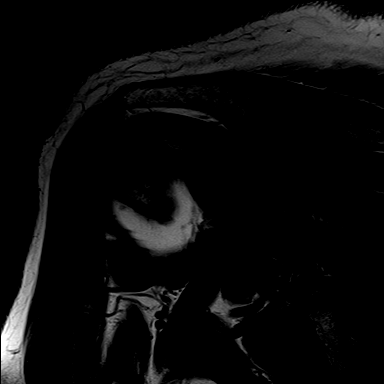
[im 19/19]
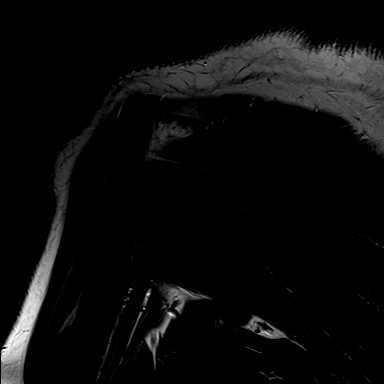

[Series 7: T2 fat-sat · oblique · 4.0mm · 0.62mm/px · 8 of 22 slices shown (2 of 2)]
[im 1/22]
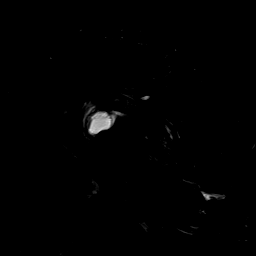
[im 4/22]
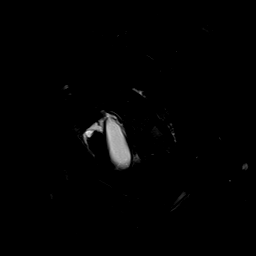
[im 7/22]
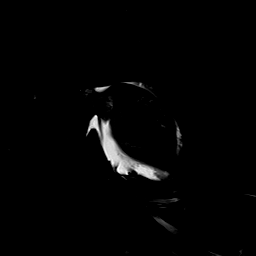
[im 10/22]
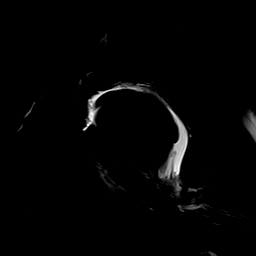
[im 13/22]
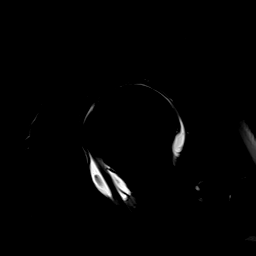
[im 16/22]
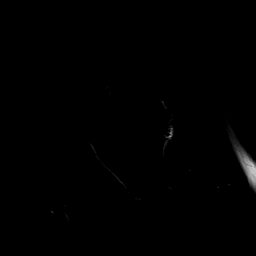
[im 19/22]
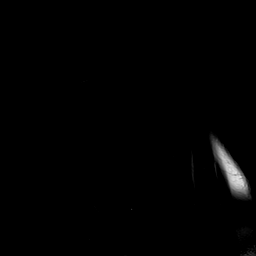
[im 22/22]
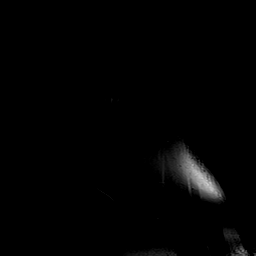

[40 of 40 positions shown; findings below may reference images not displayed]

FINDINGS: Rotator cuff: There is some thickening and heterogeneously increased
T2 signal in the rotator cuff tendons consistent with tendinopathy.
Changes are most notable in the supraspinatus. No tear is
identified.

Muscles: No focal atrophy or lesion.

Biceps long head: Intact. T2 hypointense fragments along the tendon
in the bicipital groove measuring approximately 1.5 cm AP are likely
synovium or cartilage.

Acromioclavicular Joint:  Moderate degenerative disease is present.

Glenohumeral Joint: The patient has glenohumeral osteoarthritis with
cartilage loss throughout. Multiple small subchondral cysts are seen
in the posterior glenoid. There is also some subchondral edema in
the posterior glenoid.

Labrum: The posterior labrum is degenerated and diminutive. No focal
tear is identified.

Bones: No fracture or worrisome lesion. Type 2 acromion is seen.
There is no fluid or contrast in the subacromial/subdeltoid bursa.
IMPRESSION: No acute abnormality.

Dominant finding is moderate to moderately severe glenohumeral
osteoarthritis. No labral tear is identified but there is
degeneration of the posterior labrum.

Rotator cuff tendinopathy without tear most notable in the
supraspinatus.

Moderate acromioclavicular osteoarthritis.

Intact long head of biceps with cartilage fragment or hypertrophied
synovium along the tendon in the bicipital groove.

## 2018-02-01 IMAGING — RF DG FLUORO GUIDE NDL PLC/BX
1 series · 1 of 1 positions shown · non-contrast
Comparison: none

CLINICAL DATA: Severe arthritis, bilateral shoulder pain

[Series 3: cp_standard · 0.17mm/px · 1 of 1 slices shown]
[im 1/1]
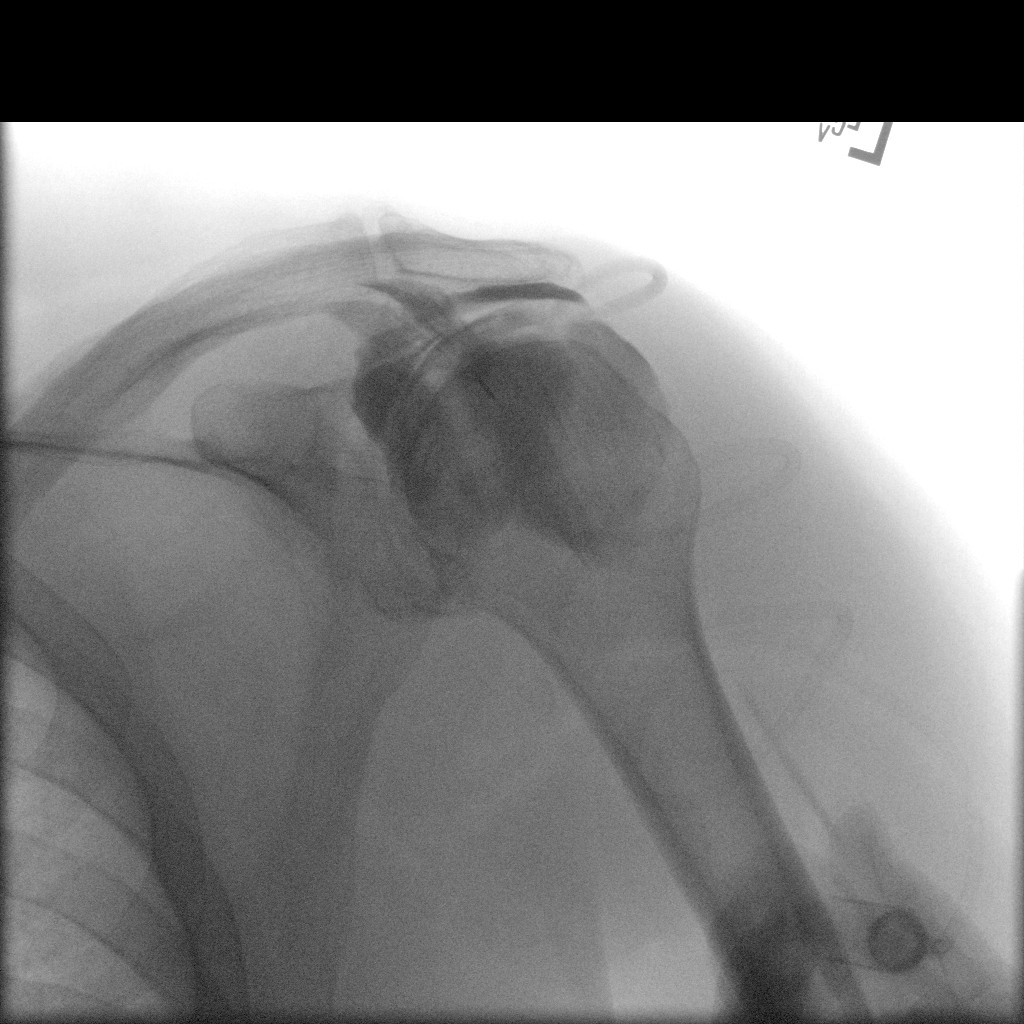

[1 of 1 positions shown; findings below may reference images not displayed]

EXAM:
Left shoulder INJECTION UNDER FLUOROSCOPY

FLUOROSCOPY TIME:  Fluoroscopy Time:  36 seconds

Radiation Exposure Index (if provided by the fluoroscopic device):
112 micro Gy per meters square

Number of Acquired Spot Images: 1

PROCEDURE:
The anticipated procedure was discussed with the patient. Risks
including bleeding, infection, and increased pain were discussed and
their management outlined. A time-out procedure was called. The skin
over the left shoulder was marked with an indelible marker.
Overlying skin prepped with Betadine, draped in the usual sterile
fashion, and infiltrated locally with 5 cc of 1% Lidocaine. A 20
gauge spinal needle advanced to the superomedial margin of the left
humeral head. Diagnostic injection of 5 cc of Isovue 200 revealed
intra-articular spread without intravascular component.

Eightymg Depo-Medrol and 7 ml bupivacaine 0.5% were then
administered. No immediate complication.
IMPRESSION: Technically successful therapeutic left shoulder injection under
fluoroscopy. The patient tolerated the procedure well.

## 2018-02-08 ENCOUNTER — Other Ambulatory Visit: Payer: Self-pay | Admitting: Family Medicine

## 2018-03-27 ENCOUNTER — Other Ambulatory Visit: Payer: Self-pay | Admitting: Family Medicine

## 2018-04-22 ENCOUNTER — Telehealth: Payer: Self-pay | Admitting: Family Medicine

## 2018-04-22 NOTE — Telephone Encounter (Signed)
Recommend he donate blood. They will check his blood type for free.

## 2018-04-22 NOTE — Telephone Encounter (Signed)
Pt wants to know if we have his blood type listed in his records .  Pt's call back is [phone]  Con [place]

## 2018-04-22 NOTE — Telephone Encounter (Signed)
There is no information in [place] either about patient's blood type. Please advise?

## 2018-04-22 NOTE — Telephone Encounter (Signed)
Please advise 

## 2018-04-22 NOTE — Telephone Encounter (Signed)
In epic. You can look in [place].

## 2018-04-23 NOTE — Telephone Encounter (Signed)
Patient was advised.  

## 2018-05-15 DIAGNOSIS — Z872 Personal history of diseases of the skin and subcutaneous tissue: Secondary | ICD-10-CM | POA: Diagnosis not present

## 2018-05-15 DIAGNOSIS — Z1283 Encounter for screening for malignant neoplasm of skin: Secondary | ICD-10-CM | POA: Diagnosis not present

## 2018-05-15 DIAGNOSIS — L578 Other skin changes due to chronic exposure to nonionizing radiation: Secondary | ICD-10-CM | POA: Diagnosis not present

## 2018-08-12 ENCOUNTER — Encounter: Payer: Self-pay | Admitting: Family Medicine

## 2018-08-12 ENCOUNTER — Ambulatory Visit (INDEPENDENT_AMBULATORY_CARE_PROVIDER_SITE_OTHER): Payer: 59 | Admitting: Family Medicine

## 2018-08-12 VITALS — BP 137/83 | HR 86 | Temp 98.7°F | Resp 16 | Ht 74.0 in | Wt 251.0 lb

## 2018-08-12 DIAGNOSIS — Z0000 Encounter for general adult medical examination without abnormal findings: Secondary | ICD-10-CM | POA: Diagnosis not present

## 2018-08-12 DIAGNOSIS — E7800 Pure hypercholesterolemia, unspecified: Secondary | ICD-10-CM | POA: Diagnosis not present

## 2018-08-12 DIAGNOSIS — Z23 Encounter for immunization: Secondary | ICD-10-CM

## 2018-08-12 DIAGNOSIS — Z86010 Personal history of colonic polyps: Secondary | ICD-10-CM

## 2018-08-12 DIAGNOSIS — E559 Vitamin D deficiency, unspecified: Secondary | ICD-10-CM | POA: Diagnosis not present

## 2018-08-12 DIAGNOSIS — Z125 Encounter for screening for malignant neoplasm of prostate: Secondary | ICD-10-CM

## 2018-08-12 DIAGNOSIS — E039 Hypothyroidism, unspecified: Secondary | ICD-10-CM
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
## 2018-08-15 DIAGNOSIS — E039 Hypothyroidism, unspecified: Secondary | ICD-10-CM | POA: Diagnosis not present

## 2018-08-15 DIAGNOSIS — Z125 Encounter for screening for malignant neoplasm of prostate: Secondary | ICD-10-CM | POA: Diagnosis not present

## 2018-08-15 DIAGNOSIS — E7800 Pure hypercholesterolemia, unspecified: Secondary | ICD-10-CM | POA: Diagnosis not present

## 2018-08-16 LAB — COMPREHENSIVE METABOLIC PANEL
A/G RATIO: 2 (ref 1.2–2.2)
ALBUMIN: 4.2 g/dL (ref 3.6–4.8)
ALT: 20 IU/L (ref 0–44)
AST: 30 IU/L (ref 0–40)
Alkaline Phosphatase: 52 IU/L (ref 39–117)
BUN / CREAT RATIO: 13 (ref 10–24)
BUN: 17 mg/dL (ref 8–27)
Bilirubin Total: 0.4 mg/dL (ref 0.0–1.2)
CO2: 21 mmol/L (ref 20–29)
CREATININE: 1.33 mg/dL — AB (ref 0.76–1.27)
Calcium: 9.2 mg/dL (ref 8.6–10.2)
Chloride: 105 mmol/L (ref 96–106)
GFR calc Af Amer: 65 mL/min/{1.73_m2} (ref >59)
GFR calc non Af Amer: 56 mL/min/{1.73_m2} — ABNORMAL LOW (ref >59)
GLOBULIN, TOTAL: 2.1 g/dL (ref 1.5–4.5)
Glucose: 93 mg/dL (ref 65–99)
POTASSIUM: 4.7 mmol/L (ref 3.5–5.2)
SODIUM: 142 mmol/L (ref 134–144)
Total Protein: 6.3 g/dL (ref 6.0–8.5)

## 2018-08-16 LAB — LIPID PANEL
CHOL/HDL RATIO: 3.4 ratio (ref 0.0–5.0)
CHOLESTEROL TOTAL: 173 mg/dL (ref 100–199)
HDL: 51 mg/dL (ref >39)
LDL CALC: 104 mg/dL — AB (ref 0–99)
Triglycerides: 91 mg/dL (ref 0–149)
VLDL CHOLESTEROL CAL: 18 mg/dL (ref 5–40)

## 2018-08-16 LAB — PSA: Prostate Specific Ag, Serum: 1.5 ng/mL (ref 0.0–4.0)

## 2018-08-16 LAB — TSH: TSH: 1.59 u[IU]/mL (ref 0.450–4.500)

## 2018-08-16 LAB — VITAMIN D 25 HYDROXY (VIT D DEFICIENCY, FRACTURES): VIT D 25 HYDROXY: 34.7 ng/mL (ref 30.0–100.0)

## 2018-08-16 LAB — T4, FREE: FREE T4: 1.76 ng/dL (ref 0.82–1.77)

## 2018-08-18 ENCOUNTER — Telehealth: Payer: Self-pay

## 2018-08-18 NOTE — Telephone Encounter (Signed)
-----   Message from Birdie Sons, MD sent at 08/16/2018  8:45 PM EDT ----- Labs are all very good. Continue current medications.  Check yearly.

## 2018-08-18 NOTE — Telephone Encounter (Signed)
Tried calling patient. Left message to call back. 

## 2018-08-19 NOTE — Telephone Encounter (Signed)
Tried calling patient on home and cell phone [phone]. Left message to call back.

## 2018-08-19 NOTE — Telephone Encounter (Signed)
Patient advised.KW 

## 2018-08-19 NOTE — Telephone Encounter (Signed)
Pt returned Roshena's call. Please call pt back.  Thanks, [employer]

## 2018-08-27 ENCOUNTER — Other Ambulatory Visit: Payer: Self-pay

## 2018-08-27 DIAGNOSIS — Z86010 Personal history of colonic polyps: Secondary | ICD-10-CM

## 2018-08-28 ENCOUNTER — Telehealth: Payer: Self-pay | Admitting: Gastroenterology

## 2018-08-28 NOTE — Telephone Encounter (Signed)
LVM to call our office for current insurance information. [employer] was terminated 08/21/2018.

## 2018-09-16 ENCOUNTER — Ambulatory Visit
Admission: RE | Admit: 2018-09-16 | Discharge: 2018-09-16 | Disposition: A | Discharge status: Home / Self Care | Payer: Commercial Managed Care - PPO | Source: Ambulatory Visit | Attending: Gastroenterology | Admitting: Gastroenterology

## 2018-09-16 ENCOUNTER — Ambulatory Visit: Payer: Commercial Managed Care - PPO | Admitting: Certified Registered Nurse Anesthetist

## 2018-09-16 ENCOUNTER — Encounter: Payer: Self-pay | Admitting: *Deleted

## 2018-09-16 ENCOUNTER — Encounter
Admission: RE | Disposition: A | Discharge status: Home / Self Care | Payer: Self-pay | Source: Ambulatory Visit | Attending: Gastroenterology

## 2018-09-16 DIAGNOSIS — Z09 Encounter for follow-up examination after completed treatment for conditions other than malignant neoplasm: Secondary | ICD-10-CM | POA: Diagnosis present

## 2018-09-16 DIAGNOSIS — K635 Polyp of colon: Secondary | ICD-10-CM

## 2018-09-16 DIAGNOSIS — Z7982 Long term (current) use of aspirin: Secondary | ICD-10-CM | POA: Diagnosis not present

## 2018-09-16 DIAGNOSIS — D123 Benign neoplasm of transverse colon: Secondary | ICD-10-CM | POA: Diagnosis not present

## 2018-09-16 DIAGNOSIS — Z79899 Other long term (current) drug therapy: Secondary | ICD-10-CM | POA: Insufficient documentation

## 2018-09-16 DIAGNOSIS — Z86010 Personal history of colonic polyps: Secondary | ICD-10-CM | POA: Insufficient documentation

## 2018-09-16 DIAGNOSIS — D124 Benign neoplasm of descending colon: Secondary | ICD-10-CM | POA: Insufficient documentation

## 2018-09-16 DIAGNOSIS — Z79890 Hormone replacement therapy (postmenopausal): Secondary | ICD-10-CM | POA: Insufficient documentation

## 2018-09-16 DIAGNOSIS — D125 Benign neoplasm of sigmoid colon: Secondary | ICD-10-CM | POA: Insufficient documentation

## 2018-09-16 HISTORY — PX: COLONOSCOPY WITH PROPOFOL: SHX5780

## 2018-09-16 SURGERY — COLONOSCOPY WITH PROPOFOL
Anesthesia: General

## 2018-09-16 MED ORDER — PROPOFOL 10 MG/ML IV BOLUS
INTRAVENOUS | Status: DC | PRN
  Administered 2018-09-16: 90 mg via INTRAVENOUS

## 2018-09-16 MED ORDER — PROPOFOL 500 MG/50ML IV EMUL
INTRAVENOUS | Status: AC
  Filled 2018-09-16: qty 50

## 2018-09-16 MED ORDER — PROPOFOL 500 MG/50ML IV EMUL
INTRAVENOUS | Status: DC | PRN
  Administered 2018-09-16: 140 ug/kg/min via INTRAVENOUS

## 2018-09-16 MED ORDER — SODIUM CHLORIDE 0.9 % IV SOLN
INTRAVENOUS | Status: DC
  Administered 2018-09-16: 1000 mL via INTRAVENOUS

## 2018-09-16 MED ORDER — LIDOCAINE HCL (CARDIAC) PF 100 MG/5ML IV SOSY
PREFILLED_SYRINGE | INTRAVENOUS | Status: DC | PRN
  Administered 2018-09-16: 50 mg via INTRAVENOUS

## 2018-09-16 MED ORDER — LIDOCAINE HCL (PF) 2 % IJ SOLN
INTRAMUSCULAR | Status: AC
  Filled 2018-09-16: qty 10

## 2018-09-16 NOTE — Anesthesia Preprocedure Evaluation (Signed)
Anesthesia Evaluation  Patient identified by MRN, date of birth, ID band Patient awake    Reviewed: Allergy & Precautions, H&P , NPO status , Patient's Chart, lab work & pertinent test results, reviewed documented beta blocker date and time   Airway Mallampati: I  TM Distance: >3 FB Neck ROM: full    Dental  (+) Caps, Dental Advidsory Given, Teeth Intact, Missing, Implants Permanent bridge on right bottom:   Pulmonary neg pulmonary ROS,           Cardiovascular Exercise Tolerance: Good negative cardio ROS       Neuro/Psych negative neurological ROS  negative psych ROS   GI/Hepatic Neg liver ROS, GERD  ,  Endo/Other  neg diabetesHypothyroidism   Renal/GU negative Renal ROS  negative genitourinary   Musculoskeletal   Abdominal   Peds  Hematology negative hematology ROS (+)   Anesthesia Other Findings Past Medical History: No date: Breast nodule     Comment:  xcised byrnette 2013 No date: Erectile dysfunction No date: FH: pancreatic cancer No date: History of colonic polyps No date: PIN (prostatic intraepithelial neoplasia)     Comment:  By biopsy in 2012. Followed by Dr. Jacqlyn Larsen 03/02/2015: Pneumonia   Reproductive/Obstetrics negative OB ROS                             Anesthesia Physical Anesthesia Plan  ASA: II  Anesthesia Plan: General   Post-op Pain Management:    Induction: Intravenous  PONV Risk Score and Plan: 2 and Propofol infusion and TIVA  Airway Management Planned: Natural Airway and Nasal Cannula  Additional Equipment:   Intra-op Plan:   Post-operative Plan:   Informed Consent: I have reviewed the patients History and Physical, chart, labs and discussed the procedure including the risks, benefits and alternatives for the proposed anesthesia with the patient or authorized representative who has indicated his/her understanding and acceptance.   Dental Advisory  Given  Plan Discussed with: Anesthesiologist, CRNA and Surgeon  Anesthesia Plan Comments:         Anesthesia Quick Evaluation

## 2018-09-16 NOTE — Op Note (Signed)
[facility] Patient Name: Ronnie [name] Procedure Date: 09/16/2018 9:38 AM MRN: [id number] Account #: [id number] Date of Birth: [birth year] Admit Type: Outpatient Age: 62 Room: [facility] ENDO ROOM 4 Gender: Male Note Status: Finalized Procedure:            Colonoscopy Indications:          High risk colon cancer surveillance: Personal history                        of colonic polyps Providers:            Lucilla Lame MD, MD Referring MD:         Kirstie Peri. Caryn Section, MD (Referring MD) Medicines:            Propofol per Anesthesia Complications:        No immediate complications. Procedure:            Pre-Anesthesia Assessment:                       - Prior to the procedure, a History and Physical was                        performed, and patient medications and allergies were                        reviewed. The patient's tolerance of previous                        anesthesia was also reviewed. The risks and benefits of                        the procedure and the sedation options and risks were                        discussed with the patient. All questions were                        answered, and informed consent was obtained. Prior                        Anticoagulants: The patient has taken no previous                        anticoagulant or antiplatelet agents. ASA Grade                        Assessment: II - A patient with mild systemic disease.                        After reviewing the risks and benefits, the patient was                        deemed in satisfactory condition to undergo the                        procedure.                       After obtaining informed consent, the colonoscope was  passed under direct vision. Throughout the procedure,                        the patient's blood pressure, pulse, and oxygen                        saturations were monitored continuously. The                        Colonoscope  was introduced through the anus and                        advanced to the the cecum, identified by appendiceal                        orifice and ileocecal valve. The colonoscopy was                        performed without difficulty. The patient tolerated the                        procedure well. The quality of the bowel preparation                        was good. Findings:      The perianal and digital rectal examinations were normal.      Two sessile polyps were found in the transverse colon. The polyps were 3       to 5 mm in size. These polyps were removed with a cold snare. Resection       and retrieval were complete.      Three sessile polyps were found in the transverse colon. The polyps were       5 to 6 mm in size. These polyps were removed with a cold snare.       Resection and retrieval were complete.      Two sessile polyps were found in the transverse colon. The polyps were 2       to 3 mm in size. These polyps were removed with a cold biopsy forceps.       Resection and retrieval were complete.      Two sessile polyps were found in the sigmoid colon. The polyps were 3 to       4 mm in size. These polyps were removed with a cold snare. Resection and       retrieval were complete. Impression:           - Two 3 to 5 mm polyps in the transverse colon, removed                        with a cold snare. Resected and retrieved.                       - Three 5 to 6 mm polyps in the transverse colon,                        removed with a cold snare. Resected and retrieved.                       - Two 2 to 3 mm polyps in the transverse colon,  removed                        with a cold biopsy forceps. Resected and retrieved.                       - Two 3 to 4 mm polyps in the sigmoid colon, removed                        with a cold snare. Resected and retrieved. Recommendation:       - Discharge patient to home.                       - Resume previous diet.                        - Continue present medications.                       - Await pathology results.                       - Repeat colonoscopy in 3 years for surveillance. Procedure Code(s):    --- Professional ---                       [phone], Colonoscopy, flexible; with removal of tumor(s),                        polyp(s), or other lesion(s) by snare technique                       45380, 52, Colonoscopy, flexible; with biopsy, single                        or multiple Diagnosis Code(s):    --- Professional ---                       Z86.010, Personal history of colonic polyps                       D12.3, Benign neoplasm of transverse colon (hepatic                        flexure or splenic flexure)                       D12.5, Benign neoplasm of sigmoid colon CPT copyright 2018 American Medical Association. All rights reserved. The codes documented in this report are preliminary and upon coder review may  be revised to meet current compliance requirements. Lucilla Lame MD, MD 09/16/2018 10:06:55 AM This report has been signed electronically. Number of Addenda: 0 Note Initiated On: 09/16/2018 9:38 AM Scope Withdrawal Time: 0 hours 16 minutes 29 seconds  Total Procedure Duration: 0 hours 19 minutes 18 seconds       [facility]
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
## 2018-09-16 NOTE — Transfer of Care (Signed)
Immediate Anesthesia Transfer of Care Note  Patient: Ronnie [name]  Procedure(s) Performed: COLONOSCOPY WITH PROPOFOL (N/A )  Patient Location: PACU and Endoscopy Unit  Anesthesia Type:General  Level of Consciousness: drowsy  Airway & Oxygen Therapy: Patient Spontanous Breathing  Post-op Assessment: Report given to RN and Post -op Vital signs reviewed and stable  Post vital signs: Reviewed and stable  Last Vitals:  Vitals Value Taken Time  BP 112/74 09/16/2018 10:09 AM  Temp    Pulse 77 09/16/2018 10:09 AM  Resp 15 09/16/2018 10:09 AM  SpO2 94 % 09/16/2018 10:09 AM  Vitals shown include unvalidated device data.  Last Pain:  Vitals:   09/16/18 0848  TempSrc: Tympanic  PainSc: 0-No pain         Complications: No apparent anesthesia complications

## 2018-09-16 NOTE — Anesthesia Post-op Follow-up Note (Signed)
Anesthesia QCDR form completed.        

## 2018-09-17 ENCOUNTER — Encounter: Payer: Self-pay | Admitting: Gastroenterology

## 2018-09-17 NOTE — Anesthesia Postprocedure Evaluation (Signed)
Anesthesia Post Note  Patient: Ronnie [name]  Procedure(s) Performed: COLONOSCOPY WITH PROPOFOL (N/A )  Patient location during evaluation: Endoscopy Anesthesia Type: General Level of consciousness: awake and alert Pain management: pain level controlled Vital Signs Assessment: post-procedure vital signs reviewed and stable Respiratory status: spontaneous breathing, nonlabored ventilation, respiratory function stable and patient connected to nasal cannula oxygen Cardiovascular status: blood pressure returned to baseline and stable Postop Assessment: no apparent nausea or vomiting Anesthetic complications: no     Last Vitals:  Vitals:   09/16/18 0848 09/16/18 1008  BP: (!) 157/93 112/74  Pulse: 79 76  Resp: 20 16  Temp: (!) 36 C (!) 36.2 C  SpO2: 98% 94%    Last Pain:  Vitals:   09/16/18 1018  TempSrc:   PainSc: 0-No pain                 Martha Clan

## 2018-09-19 LAB — SURGICAL PATHOLOGY

## 2018-09-21 ENCOUNTER — Encounter: Payer: Self-pay | Admitting: Gastroenterology

## 2018-11-06 ENCOUNTER — Other Ambulatory Visit: Payer: Self-pay | Admitting: Family Medicine

## 2018-11-28 DIAGNOSIS — N401 Enlarged prostate with lower urinary tract symptoms: Secondary | ICD-10-CM | POA: Diagnosis not present

## 2018-11-28 DIAGNOSIS — R3914 Feeling of incomplete bladder emptying: Secondary | ICD-10-CM | POA: Diagnosis not present

## 2018-11-28 DIAGNOSIS — N529 Male erectile dysfunction, unspecified: Secondary | ICD-10-CM | POA: Diagnosis not present

## 2018-12-19 ENCOUNTER — Other Ambulatory Visit: Payer: Self-pay | Admitting: Family Medicine

## 2019-02-17 ENCOUNTER — Other Ambulatory Visit: Payer: Self-pay | Admitting: Physician Assistant

## 2019-02-17 DIAGNOSIS — L237 Allergic contact dermatitis due to plants, except food: Secondary | ICD-10-CM

## 2019-02-17 MED ORDER — PREDNISONE 10 MG (21) PO TBPK: ORAL_TABLET | ORAL | 0 refills | Status: DC

## 2019-02-17 NOTE — Progress Notes (Signed)
Poison oak exposure. Steroid taper called in.

## 2019-05-01 ENCOUNTER — Other Ambulatory Visit: Payer: Self-pay | Admitting: Family Medicine

## 2019-06-12 ENCOUNTER — Other Ambulatory Visit: Payer: Self-pay | Admitting: Family Medicine

## 2019-08-17 ENCOUNTER — Ambulatory Visit (INDEPENDENT_AMBULATORY_CARE_PROVIDER_SITE_OTHER): Payer: Commercial Managed Care - PPO | Admitting: Family Medicine

## 2019-08-17 ENCOUNTER — Other Ambulatory Visit: Payer: Self-pay

## 2019-08-17 ENCOUNTER — Encounter: Payer: Self-pay | Admitting: Family Medicine

## 2019-08-17 VITALS — BP 142/90 | HR 91 | Temp 97.1°F | Resp 16 | Ht 74.0 in | Wt 253.0 lb

## 2019-08-17 DIAGNOSIS — K219 Gastro-esophageal reflux disease without esophagitis: Secondary | ICD-10-CM

## 2019-08-17 DIAGNOSIS — Z0000 Encounter for general adult medical examination without abnormal findings: Secondary | ICD-10-CM

## 2019-08-17 DIAGNOSIS — E559 Vitamin D deficiency, unspecified: Secondary | ICD-10-CM

## 2019-08-17 DIAGNOSIS — Z23 Encounter for immunization: Secondary | ICD-10-CM | POA: Diagnosis not present

## 2019-08-17 DIAGNOSIS — E039 Hypothyroidism, unspecified: Secondary | ICD-10-CM

## 2019-08-17 DIAGNOSIS — E7800 Pure hypercholesterolemia, unspecified: Secondary | ICD-10-CM

## 2019-08-17 DIAGNOSIS — Z125 Encounter for screening for malignant neoplasm of prostate: Secondary | ICD-10-CM

## 2019-08-17 NOTE — Patient Instructions (Addendum)
.   Please review the attached list of medications and notify my office if there are any errors.    Please bring all of your medications to every appointment so we can make sure that our medication list is the same as yours.   . Please go to the lab draw station in [address] on the second floor of [facility]  when you are fasting for 8 hours. Normal hours are 8:00am to 12:30pm and 1:30pm to 4:00pm Monday through Friday     
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
## 2019-08-29 LAB — LIPID PANEL
Chol/HDL Ratio: 4 ratio (ref 0.0–5.0)
Cholesterol, Total: 178 mg/dL (ref 100–199)
HDL: 45 mg/dL (ref >39)
LDL Chol Calc (NIH): 106 mg/dL — ABNORMAL HIGH (ref 0–99)
Triglycerides: 156 mg/dL — ABNORMAL HIGH (ref 0–149)
VLDL Cholesterol Cal: 27 mg/dL (ref 5–40)

## 2019-08-29 LAB — COMPREHENSIVE METABOLIC PANEL
ALT: 24 IU/L (ref 0–44)
AST: 25 IU/L (ref 0–40)
Albumin/Globulin Ratio: 1.5 (ref 1.2–2.2)
Albumin: 4 g/dL (ref 3.8–4.8)
Alkaline Phosphatase: 56 IU/L (ref 39–117)
BUN/Creatinine Ratio: 12 (ref 10–24)
BUN: 13 mg/dL (ref 8–27)
Bilirubin Total: 0.3 mg/dL (ref 0.0–1.2)
CO2: 23 mmol/L (ref 20–29)
Calcium: 9.6 mg/dL (ref 8.6–10.2)
Chloride: 105 mmol/L (ref 96–106)
Creatinine, Ser: 1.05 mg/dL (ref 0.76–1.27)
GFR calc Af Amer: 86 mL/min/{1.73_m2} (ref >59)
GFR calc non Af Amer: 75 mL/min/{1.73_m2} (ref >59)
Globulin, Total: 2.7 g/dL (ref 1.5–4.5)
Glucose: 106 mg/dL — ABNORMAL HIGH (ref 65–99)
Potassium: 4.6 mmol/L (ref 3.5–5.2)
Sodium: 142 mmol/L (ref 134–144)
Total Protein: 6.7 g/dL (ref 6.0–8.5)

## 2019-08-29 LAB — VITAMIN D 25 HYDROXY (VIT D DEFICIENCY, FRACTURES): Vit D, 25-Hydroxy: 34.6 ng/mL (ref 30.0–100.0)

## 2019-08-29 LAB — TSH: TSH: 1.4 u[IU]/mL (ref 0.450–4.500)

## 2019-08-29 LAB — T4, FREE: Free T4: 1.95 ng/dL — ABNORMAL HIGH (ref 0.82–1.77)

## 2019-08-29 LAB — PSA: Prostate Specific Ag, Serum: 1.7 ng/mL (ref 0.0–4.0)

## 2019-08-31 ENCOUNTER — Telehealth: Payer: Self-pay

## 2019-08-31 NOTE — Telephone Encounter (Signed)
LMTCB

## 2019-08-31 NOTE — Telephone Encounter (Signed)
-----   Message from Birdie Sons, MD sent at 08/30/2019  5:07 PM EST ----- Labs good, cholesterol well controlled at 178. Continue current medications.  Check yearly.

## 2019-09-02 NOTE — Telephone Encounter (Signed)
Patient's wife Hilda Blades advised. (on DPR)

## 2019-09-02 NOTE — Telephone Encounter (Signed)
Pt wanting a call back to discuss labs at [phone].  Thanks, [employer]

## 2019-09-02 NOTE — Telephone Encounter (Signed)
Patient advised.

## 2019-10-25 ENCOUNTER — Other Ambulatory Visit: Payer: Self-pay | Admitting: Family Medicine

## 2019-12-06 ENCOUNTER — Other Ambulatory Visit: Payer: Self-pay | Admitting: Family Medicine

## 2020-03-11 ENCOUNTER — Ambulatory Visit
Admission: RE | Admit: 2020-03-11 | Discharge: 2020-03-11 | Disposition: A | Discharge status: Home / Self Care | Payer: Commercial Managed Care - PPO | Attending: Physician Assistant | Admitting: Physician Assistant

## 2020-03-11 ENCOUNTER — Ambulatory Visit
Admission: RE | Admit: 2020-03-11 | Discharge: 2020-03-11 | Disposition: A | Discharge status: Home / Self Care | Payer: Commercial Managed Care - PPO | Source: Ambulatory Visit | Attending: Physician Assistant | Admitting: Physician Assistant

## 2020-03-11 ENCOUNTER — Other Ambulatory Visit: Payer: Self-pay | Admitting: Physician Assistant

## 2020-03-11 DIAGNOSIS — R059 Cough, unspecified: Secondary | ICD-10-CM

## 2020-03-11 DIAGNOSIS — R05 Cough: Secondary | ICD-10-CM | POA: Diagnosis not present

## 2020-03-14 ENCOUNTER — Telehealth: Payer: Self-pay | Admitting: Family Medicine

## 2020-03-14 MED ORDER — AZITHROMYCIN 250 MG PO TABS: ORAL_TABLET | ORAL | 0 refills | Status: AC

## 2020-03-14 NOTE — Telephone Encounter (Signed)
Patient advised.

## 2020-03-14 NOTE — Telephone Encounter (Signed)
I called patient to get more information. He says he was recently seen by his ENT for a sinus infection and was prescribed Amoxicillin 500mg  BID. Patient developed shortness of breath on Friday and his ENT ordered a chest xray on 03/11/2020.  Patient states the chest x ray done and showed pneumonia. Patient states Dr. Richardson Landry advised him to reach out to his PCP for a prescription/ treatment of pneumonia. Chest x ray report is in patient's chart for review. Patient is still on the Amoxicillin for sinus infection. Per patient: his last dose of Amoxicillin will be on Thursday (03/17/2020). [facility] [facility] [place].    Here is the Chest x ray impression.   IMPRESSION: Mild bibasilar atelectasis.  Patchy opacities in both lungs suspicious for multifocal pneumonia. Correlation with COVID-19 testing is recommended.

## 2020-03-14 NOTE — Telephone Encounter (Signed)
Have sent prescription of [facility] to [facility] [place]. He can take that in stead of amoxicillin

## 2020-03-14 NOTE — Telephone Encounter (Signed)
Pts ENT(Dr. Richardson Landry) sent information over to the office. Pt has pneumonia and needs an Rx for the chest infection /please advise

## 2020-03-18 ENCOUNTER — Telehealth: Payer: Self-pay | Admitting: Family Medicine

## 2020-03-18 NOTE — Telephone Encounter (Signed)
Pt given information per Dr Caryn Section, "Patient was prescribed azithromycin last week for pneumonia seen on chest xR ordered his ENT. Please check with patient to make sure he is improving. The azithromycin stays in the lung tissue for about 5 days after finishing, so he should improve even after the antibiotic is finished. If he is improving he does need follow up chest xray done around the middle of next week. If not improving then will need to change antibiotic."; the pt states he is feeling "100% better", and he will follow up with office of 03/22/20; will route to office for notification.

## 2020-03-18 NOTE — Telephone Encounter (Signed)
Attempted to contact pt on cell phone; left message on voicemail.

## 2020-03-18 NOTE — Telephone Encounter (Signed)
Pt's wife Hilda Blades called in regards to message; she states he can be contacted on his cell phone [phone]; will attempt to contact pt.

## 2020-03-18 NOTE — Telephone Encounter (Signed)
Patient was prescribed azithromycin last week for pneumonia seen on chest xR ordered his ENT. Please check with patient to make sure he is improving.  The azithromycin stays in the lung tissue for about 5 days after finishing, so he should improve even after the antibiotic is finished.  If he is improving he does need follow up chest xray done around the middle of next week. If not improving then will need to change antibiotic.

## 2020-03-18 NOTE — Telephone Encounter (Signed)
Attempted to contact patient, no answer left a voicemail. Okay for PEC to advise patient.  

## 2020-03-23 ENCOUNTER — Telehealth: Payer: Self-pay

## 2020-03-23 DIAGNOSIS — R059 Cough, unspecified: Secondary | ICD-10-CM

## 2020-03-23 DIAGNOSIS — J189 Pneumonia, unspecified organism: Secondary | ICD-10-CM

## 2020-03-23 NOTE — Telephone Encounter (Signed)
Order for chest x ray placed for recheck of pneumonia. Please review and sign off on order. Patient plans to have x ray done tomorrow.

## 2020-03-23 NOTE — Telephone Encounter (Signed)
Copied from [place] [id number]. Topic: General - Other >> Mar 23, 2020  3:53 PM Leward Quan A wrote: Reason for CRM: Patient called to inform Dr Caryn Section that he need a new referral to get a follow up chest Xray, any questions please call Ph# [phone]

## 2020-03-25 ENCOUNTER — Telehealth: Payer: Self-pay

## 2020-03-25 ENCOUNTER — Ambulatory Visit
Admission: RE | Admit: 2020-03-25 | Discharge: 2020-03-25 | Disposition: A | Discharge status: Home / Self Care | Payer: Commercial Managed Care - PPO | Source: Ambulatory Visit | Attending: Family Medicine | Admitting: Family Medicine

## 2020-03-25 DIAGNOSIS — J189 Pneumonia, unspecified organism: Secondary | ICD-10-CM

## 2020-03-25 DIAGNOSIS — R05 Cough: Secondary | ICD-10-CM | POA: Diagnosis present

## 2020-03-25 DIAGNOSIS — R059 Cough, unspecified: Secondary | ICD-10-CM

## 2020-03-25 NOTE — Telephone Encounter (Signed)
-----   Message from Birdie Sons, MD sent at 03/25/2020  1:54 PM EDT ----- Ronnie [name] has gone completely back to normal. No additional follow up needed.

## 2020-03-25 NOTE — Telephone Encounter (Signed)
Pt advised.   Thanks,   -Cane Dubray  

## 2020-07-23 ENCOUNTER — Other Ambulatory Visit: Payer: Self-pay | Admitting: Family Medicine

## 2020-08-24 ENCOUNTER — Other Ambulatory Visit: Payer: Self-pay | Admitting: Family Medicine

## 2020-08-24 NOTE — Telephone Encounter (Signed)
Requested Prescriptions  Pending Prescriptions Disp Refills   pravastatin (PRAVACHOL) 80 MG tablet [Pharmacy Med Name: PRAVASTATIN SODIUM 80 MG TAB] 90 tablet 0    Sig: TAKE 1 TABLET (80 MG TOTAL) BY MOUTH DAILY.     Cardiovascular:  Antilipid - Statins Failed - 08/24/2020  1:34 AM      Failed - Total Cholesterol in normal range and within 360 days    Cholesterol, Total  Date Value Ref Range Status  08/28/2019 178 100 - 199 mg/dL Final         Failed - LDL in normal range and within 360 days    LDL Cholesterol (Calc)  Date Value Ref Range Status  08/20/2017 110 (H) mg/dL (calc) Final    Comment:    Reference range: <100 . Desirable range <100 mg/dL for primary prevention;   <70 mg/dL for patients with CHD or diabetic patients  with > or = 2 CHD risk factors. Marland Kitchen LDL-C is now calculated using the Martin-Hopkins  calculation, which is a validated novel method providing  better accuracy than the Friedewald equation in the  estimation of LDL-C.  Cresenciano Genre et al. Annamaria Helling. 7322;025(42): 2061-2068  (http://education.[employer].com/faq/FAQ164)    LDL Chol Calc (NIH)  Date Value Ref Range Status  08/28/2019 106 (H) 0 - 99 mg/dL Final         Failed - HDL in normal range and within 360 days    HDL  Date Value Ref Range Status  08/28/2019 45 >39 mg/dL Final         Failed - Triglycerides in normal range and within 360 days    Triglycerides  Date Value Ref Range Status  08/28/2019 156 (H) 0 - 149 mg/dL Final         Failed - Valid encounter within last 12 months    Recent Outpatient Visits          1 year ago Annual physical exam   [facility] Birdie Sons, MD   2 years ago Annual physical exam   [facility] Birdie Sons, MD   3 years ago Annual physical exam   [facility] Birdie Sons, MD   4 years ago Need for influenza vaccination   [facility] Birdie Sons, MD   5 years ago Annual physical  exam   [facility] Birdie Sons, MD      Future Appointments            In 1 month Fisher, Kirstie Peri, MD [facility] [place] - Patient is not pregnant

## 2020-09-26 ENCOUNTER — Ambulatory Visit (INDEPENDENT_AMBULATORY_CARE_PROVIDER_SITE_OTHER): Payer: Commercial Managed Care - PPO | Admitting: Family Medicine

## 2020-09-26 ENCOUNTER — Other Ambulatory Visit: Payer: Self-pay

## 2020-09-26 ENCOUNTER — Encounter: Payer: Self-pay | Admitting: Family Medicine

## 2020-09-26 VITALS — BP 158/89 | HR 80 | Temp 98.6°F | Resp 16 | Ht 74.0 in | Wt 252.0 lb

## 2020-09-26 DIAGNOSIS — Z0000 Encounter for general adult medical examination without abnormal findings: Secondary | ICD-10-CM

## 2020-09-26 DIAGNOSIS — Z125 Encounter for screening for malignant neoplasm of prostate: Secondary | ICD-10-CM | POA: Diagnosis not present

## 2020-09-26 DIAGNOSIS — E7800 Pure hypercholesterolemia, unspecified: Secondary | ICD-10-CM

## 2020-09-26 DIAGNOSIS — R030 Elevated blood-pressure reading, without diagnosis of hypertension: Secondary | ICD-10-CM

## 2020-09-26 DIAGNOSIS — E039 Hypothyroidism, unspecified: Secondary | ICD-10-CM

## 2020-09-26 DIAGNOSIS — Z86010 Personal history of colonic polyps: Secondary | ICD-10-CM

## 2020-09-26 DIAGNOSIS — E559 Vitamin D deficiency, unspecified: Secondary | ICD-10-CM

## 2020-09-26 NOTE — Patient Instructions (Addendum)
.   Please review the attached list of medications and notify my office if there are any errors.   . Please bring all of your medications to every appointment so we can make sure that our medication list is the same as yours.   . Covid-19 vaccines: The Covid vaccines have been given to hundreds of millions of Americans and found to be very effective and are as safe as any other vaccine.   The risk of dying from Covid infections is much higher than having a serious reaction to the vaccine.  I strongly recommend getting fully vaccinated against Covid-19.   Nearly every person currently  hospitalized or dying from Covid in the [place] has the Delta variant and is unvaccinated.
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
## 2020-10-01 LAB — COMPREHENSIVE METABOLIC PANEL
ALT: 21 IU/L (ref 0–44)
AST: 23 IU/L (ref 0–40)
Albumin/Globulin Ratio: 1.6 (ref 1.2–2.2)
Albumin: 4.2 g/dL (ref 3.8–4.8)
Alkaline Phosphatase: 55 IU/L (ref 44–121)
BUN/Creatinine Ratio: 19 (ref 10–24)
BUN: 22 mg/dL (ref 8–27)
Bilirubin Total: 0.3 mg/dL (ref 0.0–1.2)
CO2: 23 mmol/L (ref 20–29)
Calcium: 9.7 mg/dL (ref 8.6–10.2)
Chloride: 107 mmol/L — ABNORMAL HIGH (ref 96–106)
Creatinine, Ser: 1.18 mg/dL (ref 0.76–1.27)
GFR calc Af Amer: 74 mL/min/{1.73_m2} (ref >59)
GFR calc non Af Amer: 64 mL/min/{1.73_m2} (ref >59)
Globulin, Total: 2.6 g/dL (ref 1.5–4.5)
Glucose: 104 mg/dL — ABNORMAL HIGH (ref 65–99)
Potassium: 4.8 mmol/L (ref 3.5–5.2)
Sodium: 143 mmol/L (ref 134–144)
Total Protein: 6.8 g/dL (ref 6.0–8.5)

## 2020-10-01 LAB — LIPID PANEL
Chol/HDL Ratio: 3.7 ratio (ref 0.0–5.0)
Cholesterol, Total: 158 mg/dL (ref 100–199)
HDL: 43 mg/dL (ref >39)
LDL Chol Calc (NIH): 98 mg/dL (ref 0–99)
Triglycerides: 91 mg/dL (ref 0–149)
VLDL Cholesterol Cal: 17 mg/dL (ref 5–40)

## 2020-10-01 LAB — PSA TOTAL (REFLEX TO FREE): Prostate Specific Ag, Serum: 2.1 ng/mL (ref 0.0–4.0)

## 2020-10-01 LAB — CBC
Hematocrit: 43.5 % (ref 37.5–51.0)
Hemoglobin: 14.5 g/dL (ref 13.0–17.7)
MCH: 30.5 pg (ref 26.6–33.0)
MCHC: 33.3 g/dL (ref 31.5–35.7)
MCV: 91 fL (ref 79–97)
Platelets: 262 10*3/uL (ref 150–450)
RBC: 4.76 x10E6/uL (ref 4.14–5.80)
RDW: 12.9 % (ref 11.6–15.4)
WBC: 6.7 10*3/uL (ref 3.4–10.8)

## 2020-10-01 LAB — VITAMIN D 25 HYDROXY (VIT D DEFICIENCY, FRACTURES): Vit D, 25-Hydroxy: 36.1 ng/mL (ref 30.0–100.0)

## 2020-10-01 LAB — TSH: TSH: 2.25 u[IU]/mL (ref 0.450–4.500)

## 2020-10-20 ENCOUNTER — Other Ambulatory Visit: Payer: Self-pay | Admitting: Family Medicine

## 2020-11-09 ENCOUNTER — Telehealth: Payer: Self-pay

## 2020-11-09 DIAGNOSIS — E7800 Pure hypercholesterolemia, unspecified: Secondary | ICD-10-CM

## 2020-11-09 DIAGNOSIS — Z7189 Other specified counseling: Secondary | ICD-10-CM

## 2020-11-09 DIAGNOSIS — Z800 Family history of malignant neoplasm of digestive organs: Secondary | ICD-10-CM

## 2020-11-09 NOTE — Telephone Encounter (Signed)
Copied from [place] [id number]. Topic: General - Call Back - No Documentation >> Nov 08, 2020  3:43 PM Erick Blinks wrote: Reason for CRM: Pt is requesting a stress test and also a pancreas test   Best contact: [phone]

## 2020-11-09 NOTE — Telephone Encounter (Signed)
Called patient to ask the reason for the tests requested, and he reports that he just turned 25 and he feels that he needs a stress test done to see if his heart is "working correctly". Patient denies having shortness of breath w/exertion, chest pains, dizziness, or headaches. He also wants to have his pancreas checked because his mother died from pancreatic cancer in her 15s and reports that this runs in his family. He reports that he talked to Dr. Tamala Julian about this before in the past, and they never got around to ordering tests.

## 2020-11-16 ENCOUNTER — Other Ambulatory Visit: Payer: Self-pay | Admitting: Family Medicine

## 2020-11-18 NOTE — Telephone Encounter (Signed)
A stress test is not routinely recommended due to high rate of false positives leading to unnecessary heart caths. But if he would like a general cardiac risk evaluation we can him refer him to the cardiologist Dr. Rockey Situ.

## 2020-11-21 NOTE — Addendum Note (Signed)
Addended by: Birdie Sons on: 11/21/2020 04:38 PM   Modules accepted: Orders

## 2020-11-21 NOTE — Telephone Encounter (Signed)
Patient is willing to see Dr. Rockey Situ for a cardiac risk evaluation. He was wanting to know if there are any screening tests for pancreatic cancer? Please advise. Thanks!

## 2020-11-21 NOTE — Telephone Encounter (Signed)
Referral placed. Unfortunately there are no effective screening tests for pancreatic cancer.

## 2020-11-22 NOTE — Telephone Encounter (Signed)
Patient advised as below. Patient says that several years ago Dr. Reginia Forts ordered a imaging test to check for pancreatic cancer due to his family history. He says that Dr. Tamala Julian told him that he should have this done periodically. I looked into patient's chart. It looks like Dr. Tamala Julian ordered an abdominal ultrasound during his CPE back on 03/19/2012 with the dx code FH: pancreatic cancer (V16.0) Impression: Refer for repeat abdominal u/s. Current Plans l ABDOMINAL ULTRASOUND COMPLETE (17001).  Patient would like to continue having this imaging test done since his last one was almost 9 years ago. Please advise.

## 2020-11-22 NOTE — Addendum Note (Signed)
Addended by: Birdie Sons on: 11/22/2020 02:22 PM   Modules accepted: Orders

## 2020-11-24 ENCOUNTER — Telehealth: Payer: Self-pay | Admitting: Family Medicine

## 2020-11-24 NOTE — Telephone Encounter (Signed)
Patient states that he is calling Ronnie [name] back in referrals. Please advise CB-  [phone]

## 2020-11-28 ENCOUNTER — Other Ambulatory Visit: Payer: Self-pay | Admitting: Family Medicine

## 2020-11-28 DIAGNOSIS — Z800 Family history of malignant neoplasm of digestive organs: Secondary | ICD-10-CM

## 2020-12-06 ENCOUNTER — Other Ambulatory Visit: Payer: Self-pay

## 2020-12-06 ENCOUNTER — Telehealth: Payer: Self-pay | Admitting: Family Medicine

## 2020-12-06 ENCOUNTER — Ambulatory Visit
Admission: RE | Admit: 2020-12-06 | Discharge: 2020-12-06 | Disposition: A | Discharge status: Home / Self Care | Payer: Commercial Managed Care - PPO | Source: Ambulatory Visit | Attending: Family Medicine | Admitting: Family Medicine

## 2020-12-06 DIAGNOSIS — Q6101 Congenital single renal cyst: Secondary | ICD-10-CM | POA: Insufficient documentation

## 2020-12-06 DIAGNOSIS — K760 Fatty (change of) liver, not elsewhere classified: Secondary | ICD-10-CM | POA: Insufficient documentation

## 2020-12-06 DIAGNOSIS — Z800 Family history of malignant neoplasm of digestive organs: Secondary | ICD-10-CM | POA: Diagnosis present

## 2020-12-06 NOTE — Telephone Encounter (Signed)
In error

## 2020-12-06 NOTE — Telephone Encounter (Signed)
'  Linus Orn' with [facility] calling with results. Not resulted yet in Epic. " Recommend MRI as pancreas not well seen on study. Left renal cyst with septation. MRI may be beneficial for this area as well." After hours call.

## 2020-12-07 ENCOUNTER — Telehealth: Payer: Self-pay

## 2020-12-07 DIAGNOSIS — Z800 Family history of malignant neoplasm of digestive organs: Secondary | ICD-10-CM

## 2020-12-07 NOTE — Telephone Encounter (Signed)
Patient advised as below. Patient wants to know which option Dr. Caryn Section thinks is best. Patient states he doesn't have much knowledge about either option and will go with whatever Dr. Caryn Section thinks is the best choice. Please advise.

## 2020-12-07 NOTE — Telephone Encounter (Signed)
-----   Message from Birdie Sons, MD sent at 12/06/2020  5:27 PM EST ----- Nothing abnormal on ultrasound, but was not well visualized. Can either do MRI or refer to genetic counselor which would help determine the best type of screening for pancreatic cancer based on genetics. There's not really any definitive recommendations so it's whichever the patient prefers.

## 2020-12-08 NOTE — Telephone Encounter (Signed)
I would recommend referral to genetic counselors. They would be able to give better recommendations for the type of screening he should have based on genetic testing.

## 2020-12-09 ENCOUNTER — Telehealth: Payer: Self-pay

## 2020-12-09 NOTE — Telephone Encounter (Signed)
Patient advised and agrees to referral. Please schedule. 

## 2020-12-09 NOTE — Telephone Encounter (Signed)
Copied from [place] [id number]. Topic: General - Other >> Dec 09, 2020  9:22 AM Tessa Lerner A wrote: Reason for CRM: Patient called requesting to speak to a member of nursing staff or triage nurse Patient shared that they had been instructed to speak with a nurse but wasn't sure by whom Please contact to advise

## 2020-12-09 NOTE — Telephone Encounter (Signed)
Tried calling patient. Left message to call back. [place] for [facility] triage to advise patient, then send send message back with patient's response.

## 2020-12-12 ENCOUNTER — Encounter: Payer: Self-pay | Admitting: Cardiology

## 2020-12-12 ENCOUNTER — Ambulatory Visit: Payer: Commercial Managed Care - PPO | Admitting: Cardiology

## 2020-12-12 ENCOUNTER — Other Ambulatory Visit: Payer: Self-pay

## 2020-12-12 VITALS — BP 146/90 | HR 72 | Ht 74.0 in | Wt 263.0 lb

## 2020-12-12 DIAGNOSIS — E7800 Pure hypercholesterolemia, unspecified: Secondary | ICD-10-CM

## 2020-12-12 DIAGNOSIS — R030 Elevated blood-pressure reading, without diagnosis of hypertension: Secondary | ICD-10-CM

## 2020-12-12 DIAGNOSIS — R072 Precordial pain: Secondary | ICD-10-CM | POA: Diagnosis not present

## 2020-12-12 NOTE — Patient Instructions (Signed)
Medication Instructions:  Your physician recommends that you continue on your current medications as directed. Please refer to the Current Medication list given to you today.  *If you need a refill on your cardiac medications before your next appointment, please call your pharmacy*  Follow-Up: At [facility], you and your health needs are our priority.  As part of our continuing mission to provide you with exceptional heart care, we have created designated Provider Care Teams.  These Care Teams include your primary Cardiologist (physician) and Advanced Practice Providers (APPs -  Physician Assistants and Nurse Practitioners) who all work together to provide you with the care you need, when you need it.  We recommend signing up for the patient portal called "MyChart".  Sign up information is provided on this After Visit Summary.  MyChart is used to connect with patients for Virtual Visits (Telemedicine).  Patients are able to view lab/test results, encounter notes, upcoming appointments, etc.  Non-urgent messages can be sent to your provider as well.   To learn more about what you can do with MyChart, go to [email or web address].    Your next appointment:   1 month(s)  The format for your next appointment:   In Person  Provider:   You may see Dr. Garen Lah or one of the following Advanced Practice Providers on your designated Care Team:    Murray Hodgkins, NP  Christell Faith, PA-C  Marrianne Mood, PA-C  Cadence Silver[place], [place]  Laurann [place], NP
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
## 2020-12-14 ENCOUNTER — Other Ambulatory Visit: Payer: Self-pay | Admitting: Family Medicine

## 2021-01-03 ENCOUNTER — Inpatient Hospital Stay: Payer: Commercial Managed Care - PPO | Attending: Oncology | Admitting: Licensed Clinical Social Worker

## 2021-01-03 ENCOUNTER — Inpatient Hospital Stay: Payer: Commercial Managed Care - PPO

## 2021-01-13 ENCOUNTER — Ambulatory Visit: Payer: Commercial Managed Care - PPO | Admitting: Cardiology

## 2021-01-24 ENCOUNTER — Inpatient Hospital Stay: Payer: Commercial Managed Care - PPO

## 2021-01-24 ENCOUNTER — Inpatient Hospital Stay: Payer: Commercial Managed Care - PPO | Admitting: Licensed Clinical Social Worker

## 2021-01-30 ENCOUNTER — Ambulatory Visit: Payer: Commercial Managed Care - PPO | Admitting: Cardiology

## 2021-01-30 ENCOUNTER — Encounter: Payer: Self-pay | Admitting: Cardiology

## 2021-01-30 ENCOUNTER — Other Ambulatory Visit: Payer: Self-pay

## 2021-01-30 VITALS — BP 162/86 | HR 87 | Ht 74.0 in | Wt 242.0 lb

## 2021-01-30 DIAGNOSIS — I10 Essential (primary) hypertension: Secondary | ICD-10-CM

## 2021-01-30 DIAGNOSIS — E7800 Pure hypercholesterolemia, unspecified: Secondary | ICD-10-CM | POA: Diagnosis not present

## 2021-01-30 MED ORDER — IRBESARTAN 75 MG PO TABS: 75.0000 mg | ORAL_TABLET | Freq: Every day | ORAL | 5 refills | Status: DC

## 2021-01-30 NOTE — Patient Instructions (Signed)
Medication Instructions:   Your physician has recommended you make the following change in your medication:   1.  START taking Irbesartan (Avapro) 75 MG once daily.  *If you need a refill on your cardiac medications before your next appointment, please call your pharmacy*   Lab Work:   If you have labs (blood work) drawn today and your tests are completely normal, you will receive your results only by: Marland Kitchen MyChart Message (if you have MyChart) OR . A paper copy in the mail If you have any lab test that is abnormal or we need to change your treatment, we will call you to review the results.   Testing/Procedures:  None ordered   Follow-Up: At [facility], you and your health needs are our priority.  As part of our continuing mission to provide you with exceptional heart care, we have created designated Provider Care Teams.  These Care Teams include your primary Cardiologist (physician) and Advanced Practice Providers (APPs -  Physician Assistants and Nurse Practitioners) who all work together to provide you with the care you need, when you need it.  We recommend signing up for the patient portal called "MyChart".  Sign up information is provided on this After Visit Summary.  MyChart is used to connect with patients for Virtual Visits (Telemedicine).  Patients are able to view lab/test results, encounter notes, upcoming appointments, etc.  Non-urgent messages can be sent to your provider as well.   To learn more about what you can do with MyChart, go to [email or web address].    Your next appointment:   6 week(s)  The format for your next appointment:   In Person  Provider:   Kate Sable, MD   Other Instructions
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
## 2021-03-17 ENCOUNTER — Other Ambulatory Visit: Payer: Self-pay

## 2021-03-17 ENCOUNTER — Encounter: Payer: Self-pay | Admitting: Cardiology

## 2021-03-17 ENCOUNTER — Ambulatory Visit: Payer: Commercial Managed Care - PPO | Admitting: Cardiology

## 2021-03-17 VITALS — BP 150/80 | HR 92 | Ht 74.0 in | Wt 233.0 lb

## 2021-03-17 DIAGNOSIS — E7800 Pure hypercholesterolemia, unspecified: Secondary | ICD-10-CM

## 2021-03-17 DIAGNOSIS — I10 Essential (primary) hypertension: Secondary | ICD-10-CM

## 2021-03-17 MED ORDER — IRBESARTAN 150 MG PO TABS: 150.0000 mg | ORAL_TABLET | Freq: Every day | ORAL | 1 refills | Status: DC

## 2021-03-17 NOTE — Patient Instructions (Signed)
Medication Instructions:  - Your physician has recommended you make the following change in your medication:   1) INCREASE irbesartan to 150 mg - take 1 tablet by mouth once daily   *If you need a refill on your cardiac medications before your next appointment, please call your pharmacy*   Lab Work: - none ordered  If you have labs (blood work) drawn today and your tests are completely normal, you will receive your results only by: Marland Kitchen MyChart Message (if you have MyChart) OR . A paper copy in the mail If you have any lab test that is abnormal or we need to change your treatment, we will call you to review the results.   Testing/Procedures: - none ordered   Follow-Up: At [facility], you and your health needs are our priority.  As part of our continuing mission to provide you with exceptional heart care, we have created designated Provider Care Teams.  These Care Teams include your primary Cardiologist (physician) and Advanced Practice Providers (APPs -  Physician Assistants and Nurse Practitioners) who all work together to provide you with the care you need, when you need it.  We recommend signing up for the patient portal called "MyChart".  Sign up information is provided on this After Visit Summary.  MyChart is used to connect with patients for Virtual Visits (Telemedicine).  Patients are able to view lab/test results, encounter notes, upcoming appointments, etc.  Non-urgent messages can be sent to your provider as well.   To learn more about what you can do with MyChart, go to [email or web address].    Your next appointment:   3 month(s)  The format for your next appointment:   In Person  Provider:   You may see Kate Sable, MD or one of the following Advanced Practice Providers on your designated Care Team:    Murray Hodgkins, NP  Christell Faith, PA-C  Marrianne Mood, PA-C  Cadence [place], [place]  Laurann Montana, NP    Other Instructions n/a
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
## 2021-05-11 DIAGNOSIS — H6501 Acute serous otitis media, right ear: Secondary | ICD-10-CM | POA: Diagnosis not present

## 2021-05-11 DIAGNOSIS — J0100 Acute maxillary sinusitis, unspecified: Secondary | ICD-10-CM | POA: Diagnosis not present

## 2021-05-12 ENCOUNTER — Other Ambulatory Visit: Payer: Self-pay | Admitting: Family Medicine

## 2021-06-23 ENCOUNTER — Encounter: Payer: Self-pay | Admitting: Cardiology

## 2021-06-23 ENCOUNTER — Other Ambulatory Visit: Payer: Self-pay

## 2021-06-23 ENCOUNTER — Ambulatory Visit: Payer: BC Managed Care – PPO | Admitting: Cardiology

## 2021-06-23 VITALS — BP 150/78 | HR 91 | Ht 73.5 in | Wt 231.0 lb

## 2021-06-23 DIAGNOSIS — E7800 Pure hypercholesterolemia, unspecified: Secondary | ICD-10-CM

## 2021-06-23 DIAGNOSIS — I10 Essential (primary) hypertension: Secondary | ICD-10-CM | POA: Diagnosis not present

## 2021-06-23 MED ORDER — IRBESARTAN 300 MG PO TABS: 300.0000 mg | ORAL_TABLET | Freq: Every day | ORAL | 5 refills | Status: DC
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
## 2021-06-23 NOTE — Patient Instructions (Signed)
Medication Instructions:   Your physician has recommended you make the following change in your medication:    INCREASE your Irbesartan to 300 MG once a day.   *If you need a refill on your cardiac medications before your next appointment, please call your pharmacy*   Lab Work: None ordered If you have labs (blood work) drawn today and your tests are completely normal, you will receive your results only by: [place] (if you have MyChart) OR A paper copy in the mail If you have any lab test that is abnormal or we need to change your treatment, we will call you to review the results.   Testing/Procedures: None ordered   Follow-Up: At [facility], you and your health needs are our priority.  As part of our continuing mission to provide you with exceptional heart care, we have created designated Provider Care Teams.  These Care Teams include your primary Cardiologist (physician) and Advanced Practice Providers (APPs -  Physician Assistants and Nurse Practitioners) who all work together to provide you with the care you need, when you need it.  We recommend signing up for the patient portal called "MyChart".  Sign up information is provided on this After Visit Summary.  MyChart is used to connect with patients for Virtual Visits (Telemedicine).  Patients are able to view lab/test results, encounter notes, upcoming appointments, etc.  Non-urgent messages can be sent to your provider as well.   To learn more about what you can do with MyChart, go to [email or web address].    Your next appointment:   3 month(s)  The format for your next appointment:   In Person  Provider:   Kate Sable, MD   Other Instructions

## 2021-07-31 ENCOUNTER — Telehealth: Payer: Self-pay | Admitting: Gastroenterology

## 2021-07-31 NOTE — Telephone Encounter (Signed)
Pt. Said he received a letter top schedule a colonoscopy

## 2021-08-01 ENCOUNTER — Other Ambulatory Visit: Payer: Self-pay

## 2021-08-01 DIAGNOSIS — Z86010 Personal history of colonic polyps: Secondary | ICD-10-CM

## 2021-08-01 MED ORDER — NA SULFATE-K SULFATE-MG SULF 17.5-3.13-1.6 GM/177ML PO SOLN: 1.0000 | Freq: Once | ORAL | 0 refills | Status: AC

## 2021-08-01 NOTE — Progress Notes (Signed)
Gastroenterology Pre-Procedure Review  Request Date: 08/29/2021 Requesting Physician: Dr. Allen Norris  PATIENT REVIEW QUESTIONS: The patient responded to the following health history questions as indicated:    1. Are you having any GI issues? no 2. Do you have a personal history of Polyps? yes (REMOVED ) 3. Do you have a family history of Colon Cancer or Polyps? no 4. Diabetes Mellitus? no 5. Joint replacements in the past 12 months?no 6. Major health problems in the past 3 months?no 7. Any artificial heart valves, MVP, or defibrillator?no    MEDICATIONS & ALLERGIES:    Patient reports the following regarding taking any anticoagulation/antiplatelet therapy:   Plavix, Coumadin, Eliquis, Xarelto, Lovenox, Pradaxa, Brilinta, or Effient? no Aspirin? 81MG   Patient confirms/reports the following medications:  Current Outpatient Medications  Medication Sig Dispense Refill   aspirin 81 MG tablet Take 1 tablet by mouth daily.     cholecalciferol (VITAMIN D) 1000 UNITS tablet Take 1 tablet by mouth daily.     Coenzyme Q10 (CO Q 10) 100 MG CAPS Take 1 capsule by mouth daily.     Echinacea 350 MG CAPS Take 1 capsule by mouth daily.     fexofenadine (ALLEGRA) 180 MG tablet Take 1 tablet by mouth daily.     finasteride (PROSCAR) 5 MG tablet TAKE 1 TABLET BY MOUTH EVERY DAY 90 tablet 2   ibuprofen (ADVIL,MOTRIN) 400 MG tablet Take 400 mg by mouth every 4 (four) hours as needed. for pain  0   irbesartan (AVAPRO) 300 MG tablet Take 1 tablet (300 mg total) by mouth daily. 30 tablet 5   levothyroxine (SYNTHROID) 200 MCG tablet TAKE 1 TABLET BY MOUTH EVERY MORNING ON AN EMPTY STOMACH 90 tablet 3   pantoprazole (PROTONIX) 40 MG tablet TAKE ONE TABLET BY MOUTH ONE TIME DAILY FOR REFLUX 90 tablet 4   pravastatin (PRAVACHOL) 80 MG tablet TAKE 1 TABLET BY MOUTH EVERY DAY 90 tablet 1   sildenafil (REVATIO) 20 MG tablet Take by mouth.     Zinc 50 MG CAPS Take 1 capsule by mouth daily.     No current  facility-administered medications for this visit.    Patient confirms/reports the following allergies:  No Known Allergies  No orders of the defined types were placed in this encounter.   AUTHORIZATION INFORMATION Primary Insurance: 1D#: Group #:  Secondary Insurance: 1D#: Group #:  SCHEDULE INFORMATION: Date: 08/29/2021 Time: Location: [place]

## 2021-08-14 DIAGNOSIS — Z86018 Personal history of other benign neoplasm: Secondary | ICD-10-CM | POA: Diagnosis not present

## 2021-08-14 DIAGNOSIS — L578 Other skin changes due to chronic exposure to nonionizing radiation: Secondary | ICD-10-CM | POA: Diagnosis not present

## 2021-08-14 DIAGNOSIS — Z872 Personal history of diseases of the skin and subcutaneous tissue: Secondary | ICD-10-CM | POA: Diagnosis not present

## 2021-08-14 DIAGNOSIS — B078 Other viral warts: Secondary | ICD-10-CM | POA: Diagnosis not present

## 2021-08-21 DIAGNOSIS — M19011 Primary osteoarthritis, right shoulder: Secondary | ICD-10-CM | POA: Diagnosis not present

## 2021-08-21 DIAGNOSIS — M7541 Impingement syndrome of right shoulder: Secondary | ICD-10-CM | POA: Diagnosis not present

## 2021-08-21 DIAGNOSIS — M7542 Impingement syndrome of left shoulder: Secondary | ICD-10-CM | POA: Diagnosis not present

## 2021-08-29 ENCOUNTER — Encounter: Payer: Self-pay | Admitting: Gastroenterology

## 2021-08-29 ENCOUNTER — Ambulatory Visit: Payer: BC Managed Care – PPO | Admitting: Anesthesiology

## 2021-08-29 ENCOUNTER — Ambulatory Visit
Admission: RE | Admit: 2021-08-29 | Discharge: 2021-08-29 | Disposition: A | Discharge status: Home / Self Care | Payer: BC Managed Care – PPO | Attending: Gastroenterology | Admitting: Gastroenterology

## 2021-08-29 ENCOUNTER — Other Ambulatory Visit: Payer: Self-pay

## 2021-08-29 ENCOUNTER — Encounter
Admission: RE | Disposition: A | Discharge status: Home / Self Care | Payer: Self-pay | Source: Home / Self Care | Attending: Gastroenterology

## 2021-08-29 DIAGNOSIS — Z86010 Personal history of colonic polyps: Secondary | ICD-10-CM | POA: Diagnosis not present

## 2021-08-29 DIAGNOSIS — Z09 Encounter for follow-up examination after completed treatment for conditions other than malignant neoplasm: Secondary | ICD-10-CM | POA: Diagnosis not present

## 2021-08-29 DIAGNOSIS — K219 Gastro-esophageal reflux disease without esophagitis: Secondary | ICD-10-CM | POA: Insufficient documentation

## 2021-08-29 DIAGNOSIS — N529 Male erectile dysfunction, unspecified: Secondary | ICD-10-CM | POA: Diagnosis not present

## 2021-08-29 DIAGNOSIS — D122 Benign neoplasm of ascending colon: Secondary | ICD-10-CM | POA: Diagnosis not present

## 2021-08-29 DIAGNOSIS — Z800 Family history of malignant neoplasm of digestive organs: Secondary | ICD-10-CM | POA: Insufficient documentation

## 2021-08-29 DIAGNOSIS — D124 Benign neoplasm of descending colon: Secondary | ICD-10-CM | POA: Insufficient documentation

## 2021-08-29 DIAGNOSIS — Z860101 Personal history of adenomatous and serrated colon polyps: Secondary | ICD-10-CM

## 2021-08-29 DIAGNOSIS — K635 Polyp of colon: Secondary | ICD-10-CM

## 2021-08-29 DIAGNOSIS — E039 Hypothyroidism, unspecified: Secondary | ICD-10-CM | POA: Diagnosis not present

## 2021-08-29 DIAGNOSIS — K640 First degree hemorrhoids: Secondary | ICD-10-CM | POA: Diagnosis not present

## 2021-08-29 DIAGNOSIS — Z1211 Encounter for screening for malignant neoplasm of colon: Secondary | ICD-10-CM | POA: Diagnosis not present

## 2021-08-29 HISTORY — PX: COLONOSCOPY WITH PROPOFOL: SHX5780

## 2021-08-29 SURGERY — COLONOSCOPY WITH PROPOFOL
Anesthesia: General

## 2021-08-29 MED ORDER — LIDOCAINE HCL (CARDIAC) PF 100 MG/5ML IV SOSY
PREFILLED_SYRINGE | INTRAVENOUS | Status: DC | PRN
  Administered 2021-08-29: 50 mg via INTRAVENOUS

## 2021-08-29 MED ORDER — PROPOFOL 500 MG/50ML IV EMUL
INTRAVENOUS | Status: AC
  Filled 2021-08-29: qty 50

## 2021-08-29 MED ORDER — PHENYLEPHRINE HCL (PRESSORS) 10 MG/ML IV SOLN
INTRAVENOUS | Status: AC
  Filled 2021-08-29: qty 1

## 2021-08-29 MED ORDER — LIDOCAINE HCL (PF) 2 % IJ SOLN
INTRAMUSCULAR | Status: AC
  Filled 2021-08-29: qty 5

## 2021-08-29 MED ORDER — PROPOFOL 10 MG/ML IV BOLUS
INTRAVENOUS | Status: DC | PRN
  Administered 2021-08-29: 50 mg via INTRAVENOUS

## 2021-08-29 MED ORDER — PROPOFOL 500 MG/50ML IV EMUL
INTRAVENOUS | Status: DC | PRN
Start: 2021-08-29 — End: 2021-08-29
  Administered 2021-08-29: 125 ug/kg/min via INTRAVENOUS

## 2021-08-29 MED ORDER — SODIUM CHLORIDE 0.9 % IV SOLN: INTRAVENOUS | Status: DC

## 2021-08-29 NOTE — Op Note (Signed)
[facility]enter[facility] Patient Name: Ronnie [name] Procedure Date: 08/29/2021 8:46 AM MRN: [id number] Account #: [id number] Date of Birth: 1955/10/25 Admit Type: Outpatient Age: 65 Room: [facility] ENDO ROOM 4 Gender: Male Note Status: Finalized Instrument Name: Jasper Riling [id number] Procedure:             Colonoscopy Indications:           High risk colon cancer surveillance: Personal history                         of colonic polyps Providers:             Lucilla Lame MD, MD Medicines:             Propofol per Anesthesia Complications:         No immediate complications. Procedure:             Pre-Anesthesia Assessment:                        - Prior to the procedure, a History and Physical was                         performed, and patient medications and allergies were                         reviewed. The patient's tolerance of previous                         anesthesia was also reviewed. The risks and benefits                         of the procedure and the sedation options and risks                         were discussed with the patient. All questions were                         answered, and informed consent was obtained. Prior                         Anticoagulants: The patient has taken no previous                         anticoagulant or antiplatelet agents. ASA Grade                         Assessment: II - A patient with mild systemic disease.                         After reviewing the risks and benefits, the patient                         was deemed in satisfactory condition to undergo the                         procedure.                        After obtaining informed consent, the colonoscope was  passed under direct vision. Throughout the procedure,                         the patient's blood pressure, pulse, and oxygen                         saturations were monitored continuously. The                          Colonoscope was introduced through the anus and                         advanced to the the cecum, identified by appendiceal                         orifice and ileocecal valve. The colonoscopy was                         performed without difficulty. The patient tolerated                         the procedure well. The quality of the bowel                         preparation was excellent. Findings:      The perianal and digital rectal examinations were normal.      Two sessile polyps were found in the ascending colon. The polyps were 3       to 6 mm in size. These polyps were removed with a cold snare. Resection       and retrieval were complete.      A 7 mm polyp was found in the descending colon. The polyp was sessile.       The polyp was removed with a cold snare. Resection and retrieval were       complete.      A 2 mm polyp was found in the sigmoid colon. The polyp was sessile. The       polyp was removed with a cold snare. Resection and retrieval were       complete.      Non-bleeding internal hemorrhoids were found during retroflexion. The       hemorrhoids were Grade I (internal hemorrhoids that do not prolapse). Impression:            - Two 3 to 6 mm polyps in the ascending colon, removed                         with a cold snare. Resected and retrieved.                        - One 7 mm polyp in the descending colon, removed with                         a cold snare. Resected and retrieved.                        - One 2 mm polyp in the sigmoid colon, removed with a  cold snare. Resected and retrieved.                        - Non-bleeding internal hemorrhoids. Recommendation:        - Discharge patient to home.                        - Resume previous diet.                        - Continue present medications.                        - Await pathology results.                        - Repeat colonoscopy in 5 years for surveillance. Procedure Code(s):      --- Professional ---                        [phone], Colonoscopy, flexible; with removal of                         tumor(s), polyp(s), or other lesion(s) by snare                         technique Diagnosis Code(s):     --- Professional ---                        Z86.010, Personal history of colonic polyps                        K63.5, Polyp of colon CPT copyright 2019 American Medical Association. All rights reserved. The codes documented in this report are preliminary and upon coder review may  be revised to meet current compliance requirements. Lucilla Lame MD, MD 08/29/2021 9:10:05 AM This report has been signed electronically. Number of Addenda: 0 Note Initiated On: 08/29/2021 8:46 AM Scope Withdrawal Time: 0 hours 5 minutes 39 seconds  Total Procedure Duration: 0 hours 12 minutes 6 seconds  Estimated Blood Loss:  Estimated blood loss: none.      [facility]

## 2021-08-29 NOTE — Anesthesia Postprocedure Evaluation (Signed)
Anesthesia Post Note  Patient: Ronnie [name]  Procedure(s) Performed: COLONOSCOPY WITH PROPOFOL  Patient location during evaluation: Endoscopy Anesthesia Type: General Level of consciousness: awake and alert Pain management: pain level controlled Vital Signs Assessment: post-procedure vital signs reviewed and stable Respiratory status: spontaneous breathing, nonlabored ventilation, respiratory function stable and patient connected to nasal cannula oxygen Cardiovascular status: blood pressure returned to baseline and stable Postop Assessment: no apparent nausea or vomiting Anesthetic complications: no   No notable events documented.   Last Vitals:  Vitals:   08/29/21 0930 08/29/21 0940  BP: (!) 142/90 (!) 143/88  Pulse: 67 66  Resp: 18 18  Temp:    SpO2: 100% 99%    Last Pain:  Vitals:   08/29/21 0910  TempSrc: Temporal  PainSc:                  Arita Miss
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
## 2021-08-29 NOTE — Transfer of Care (Signed)
Immediate Anesthesia Transfer of Care Note  Patient: Ronnie [name]  Procedure(s) Performed: COLONOSCOPY WITH PROPOFOL  Patient Location: PACU  Anesthesia Type:General  Level of Consciousness: awake and alert   Airway & Oxygen Therapy: Patient Spontanous Breathing  Post-op Assessment: Report given to RN and Post -op Vital signs reviewed and stable  Post vital signs: Reviewed and stable  Last Vitals:  Vitals Value Taken Time  BP 121/79 08/29/21 0912  Temp 37.1 C 08/29/21 0910  Pulse 70 08/29/21 0912  Resp 16 08/29/21 0912  SpO2 95 % 08/29/21 0912  Vitals shown include unvalidated device data.  Last Pain:  Vitals:   08/29/21 0910  TempSrc: Temporal  PainSc:          Complications: No notable events documented.

## 2021-08-29 NOTE — Anesthesia Preprocedure Evaluation (Signed)
Anesthesia Evaluation  Patient identified by MRN, date of birth, ID band Patient awake    Reviewed: Allergy & Precautions, NPO status , Patient's Chart, lab work & pertinent test results  History of Anesthesia Complications Negative for: history of anesthetic complications  Airway Mallampati: II  TM Distance: >3 FB Neck ROM: Full    Dental no notable dental hx. (+) Teeth Intact   Pulmonary neg pulmonary ROS, neg sleep apnea, neg COPD, Patient abstained from smoking.Not current smoker,    Pulmonary exam normal breath sounds clear to auscultation       Cardiovascular Exercise Tolerance: Good METS(-) hypertension(-) CAD and (-) Past MI negative cardio ROS  (-) dysrhythmias  Rhythm:Regular Rate:Normal - Systolic murmurs    Neuro/Psych negative neurological ROS  negative psych ROS   GI/Hepatic GERD  ,(+)     (-) substance abuse  ,   Endo/Other  neg diabetesHypothyroidism   Renal/GU negative Renal ROS     Musculoskeletal   Abdominal   Peds  Hematology   Anesthesia Other Findings Past Medical History: No date: Breast nodule     Comment:  xcised byrnette 2013 No date: Erectile dysfunction No date: FH: pancreatic cancer No date: History of colonic polyps No date: PIN (prostatic intraepithelial neoplasia)     Comment:  By biopsy in 2012. Followed by Dr. Jacqlyn Larsen 03/02/2015: Pneumonia  Reproductive/Obstetrics                             Anesthesia Physical Anesthesia Plan  ASA: 2  Anesthesia Plan: General   Post-op Pain Management:    Induction: Intravenous  PONV Risk Score and Plan: 2 and Ondansetron, Propofol infusion and TIVA  Airway Management Planned: Nasal Cannula  Additional Equipment: None  Intra-op Plan:   Post-operative Plan:   Informed Consent: I have reviewed the patients History and Physical, chart, labs and discussed the procedure including the risks, benefits and  alternatives for the proposed anesthesia with the patient or authorized representative who has indicated his/her understanding and acceptance.     Dental advisory given  Plan Discussed with: CRNA and Surgeon  Anesthesia Plan Comments: (Discussed risks of anesthesia with patient, including possibility of difficulty with spontaneous ventilation under anesthesia necessitating airway intervention, PONV, and rare risks such as cardiac or respiratory or neurological events, and allergic reactions. Discussed the role of CRNA in patient's perioperative care. Patient understands.)        Anesthesia Quick Evaluation

## 2021-08-30 ENCOUNTER — Encounter: Payer: Self-pay | Admitting: Gastroenterology

## 2021-08-30 LAB — SURGICAL PATHOLOGY

## 2021-08-31 ENCOUNTER — Encounter: Payer: Self-pay | Admitting: Gastroenterology

## 2021-09-01 ENCOUNTER — Encounter: Payer: Self-pay | Admitting: Family Medicine

## 2021-09-22 ENCOUNTER — Ambulatory Visit: Payer: BC Managed Care – PPO | Admitting: Cardiology

## 2021-10-06 ENCOUNTER — Other Ambulatory Visit: Payer: Self-pay

## 2021-10-06 ENCOUNTER — Encounter: Payer: Self-pay | Admitting: Cardiology

## 2021-10-06 ENCOUNTER — Ambulatory Visit: Payer: BC Managed Care – PPO | Admitting: Cardiology

## 2021-10-06 VITALS — BP 152/90 | HR 85 | Ht 74.0 in | Wt 229.4 lb

## 2021-10-06 DIAGNOSIS — I10 Essential (primary) hypertension: Secondary | ICD-10-CM | POA: Diagnosis not present

## 2021-10-06 DIAGNOSIS — E7800 Pure hypercholesterolemia, unspecified: Secondary | ICD-10-CM | POA: Diagnosis not present

## 2021-10-06 MED ORDER — AMLODIPINE BESYLATE 5 MG PO TABS: 5.0000 mg | ORAL_TABLET | Freq: Every day | ORAL | 3 refills | Status: DC
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
## 2021-10-06 NOTE — Patient Instructions (Signed)
Medication Instructions:   Your physician has recommended you make the following change in your medication:    START taking Amlodipine 5 MG once a day.   *If you need a refill on your cardiac medications before your next appointment, please call your pharmacy*   Lab Work: None ordered If you have labs (blood work) drawn today and your tests are completely normal, you will receive your results only by: [place] (if you have MyChart) OR A paper copy in the mail If you have any lab test that is abnormal or we need to change your treatment, we will call you to review the results.   Testing/Procedures: None ordered   Follow-Up: At [facility], you and your health needs are our priority.  As part of our continuing mission to provide you with exceptional heart care, we have created designated Provider Care Teams.  These Care Teams include your primary Cardiologist (physician) and Advanced Practice Providers (APPs -  Physician Assistants and Nurse Practitioners) who all work together to provide you with the care you need, when you need it.  We recommend signing up for the patient portal called "MyChart".  Sign up information is provided on this After Visit Summary.  MyChart is used to connect with patients for Virtual Visits (Telemedicine).  Patients are able to view lab/test results, encounter notes, upcoming appointments, etc.  Non-urgent messages can be sent to your provider as well.   To learn more about what you can do with MyChart, go to [email or web address].    Your next appointment:   3 month(s)  The format for your next appointment:   In Person  Provider:   You may see Kate Sable, MD or one of the following Advanced Practice Providers on your designated Care Team:   Murray Hodgkins, NP Christell Faith, PA-C Cadence Kathlen Mody, [place]    Other Instructions

## 2021-12-12 ENCOUNTER — Other Ambulatory Visit: Payer: Self-pay

## 2021-12-12 ENCOUNTER — Encounter: Payer: Self-pay | Admitting: Family Medicine

## 2021-12-12 ENCOUNTER — Ambulatory Visit (INDEPENDENT_AMBULATORY_CARE_PROVIDER_SITE_OTHER): Payer: BC Managed Care – PPO | Admitting: Family Medicine

## 2021-12-12 VITALS — BP 132/80 | HR 99 | Temp 98.7°F | Resp 16 | Ht 74.0 in | Wt 230.2 lb

## 2021-12-12 DIAGNOSIS — E559 Vitamin D deficiency, unspecified: Secondary | ICD-10-CM

## 2021-12-12 DIAGNOSIS — Z125 Encounter for screening for malignant neoplasm of prostate: Secondary | ICD-10-CM

## 2021-12-12 DIAGNOSIS — E7800 Pure hypercholesterolemia, unspecified: Secondary | ICD-10-CM

## 2021-12-12 DIAGNOSIS — Z0000 Encounter for general adult medical examination without abnormal findings: Secondary | ICD-10-CM | POA: Diagnosis not present

## 2021-12-12 DIAGNOSIS — E039 Hypothyroidism, unspecified: Secondary | ICD-10-CM

## 2021-12-12 DIAGNOSIS — I10 Essential (primary) hypertension: Secondary | ICD-10-CM

## 2021-12-12 DIAGNOSIS — Z23 Encounter for immunization: Secondary | ICD-10-CM
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
## 2021-12-13 DIAGNOSIS — I10 Essential (primary) hypertension: Secondary | ICD-10-CM | POA: Insufficient documentation

## 2021-12-20 DIAGNOSIS — E559 Vitamin D deficiency, unspecified: Secondary | ICD-10-CM | POA: Diagnosis not present

## 2021-12-20 DIAGNOSIS — E7800 Pure hypercholesterolemia, unspecified: Secondary | ICD-10-CM | POA: Diagnosis not present

## 2021-12-20 DIAGNOSIS — E039 Hypothyroidism, unspecified: Secondary | ICD-10-CM | POA: Diagnosis not present

## 2021-12-21 LAB — COMPREHENSIVE METABOLIC PANEL
ALT: 26 IU/L (ref 0–44)
AST: 30 IU/L (ref 0–40)
Albumin/Globulin Ratio: 1.8 (ref 1.2–2.2)
Albumin: 4.6 g/dL (ref 3.8–4.8)
Alkaline Phosphatase: 62 IU/L (ref 44–121)
BUN/Creatinine Ratio: 13 (ref 10–24)
BUN: 14 mg/dL (ref 8–27)
Bilirubin Total: 0.4 mg/dL (ref 0.0–1.2)
CO2: 23 mmol/L (ref 20–29)
Calcium: 10 mg/dL (ref 8.6–10.2)
Chloride: 106 mmol/L (ref 96–106)
Creatinine, Ser: 1.06 mg/dL (ref 0.76–1.27)
Globulin, Total: 2.5 g/dL (ref 1.5–4.5)
Glucose: 98 mg/dL (ref 70–99)
Potassium: 4.8 mmol/L (ref 3.5–5.2)
Sodium: 145 mmol/L — ABNORMAL HIGH (ref 134–144)
Total Protein: 7.1 g/dL (ref 6.0–8.5)
eGFR: 77 mL/min/{1.73_m2} (ref >59)

## 2021-12-21 LAB — LIPID PANEL
Chol/HDL Ratio: 3.6 ratio (ref 0.0–5.0)
Cholesterol, Total: 192 mg/dL (ref 100–199)
HDL: 54 mg/dL (ref >39)
LDL Chol Calc (NIH): 110 mg/dL — ABNORMAL HIGH (ref 0–99)
Triglycerides: 158 mg/dL — ABNORMAL HIGH (ref 0–149)
VLDL Cholesterol Cal: 28 mg/dL (ref 5–40)

## 2021-12-21 LAB — TSH: TSH: 4.73 u[IU]/mL — ABNORMAL HIGH (ref 0.450–4.500)

## 2021-12-21 LAB — VITAMIN D 25 HYDROXY (VIT D DEFICIENCY, FRACTURES): Vit D, 25-Hydroxy: 35.5 ng/mL (ref 30.0–100.0)

## 2021-12-24 ENCOUNTER — Other Ambulatory Visit: Payer: Self-pay | Admitting: Family Medicine

## 2022-01-04 ENCOUNTER — Other Ambulatory Visit: Payer: Self-pay

## 2022-01-04 MED ORDER — IRBESARTAN 300 MG PO TABS: 300.0000 mg | ORAL_TABLET | Freq: Every day | ORAL | 0 refills | Status: DC

## 2022-01-05 ENCOUNTER — Ambulatory Visit: Payer: BC Managed Care – PPO | Admitting: Cardiology

## 2022-01-05 ENCOUNTER — Other Ambulatory Visit: Payer: Self-pay

## 2022-01-05 ENCOUNTER — Encounter: Payer: Self-pay | Admitting: Cardiology

## 2022-01-05 VITALS — BP 132/82 | HR 93 | Ht 74.0 in | Wt 226.0 lb

## 2022-01-05 DIAGNOSIS — I10 Essential (primary) hypertension: Secondary | ICD-10-CM | POA: Diagnosis not present

## 2022-01-05 DIAGNOSIS — E7800 Pure hypercholesterolemia, unspecified: Secondary | ICD-10-CM | POA: Diagnosis not present

## 2022-01-05 MED ORDER — AMLODIPINE BESYLATE 5 MG PO TABS: 5.0000 mg | ORAL_TABLET | Freq: Every day | ORAL | 1 refills | Status: DC

## 2022-01-05 MED ORDER — IRBESARTAN 300 MG PO TABS: 300.0000 mg | ORAL_TABLET | Freq: Every day | ORAL | 1 refills | Status: DC

## 2022-01-05 NOTE — Patient Instructions (Signed)
Medication Instructions:  ?Your physician recommends that you continue on your current medications as directed. Please refer to the Current Medication list given to you today. ? ?*If you need a refill on your cardiac medications before your next appointment, please call your pharmacy* ? ? ?Lab Work: ?None ordered ?If you have labs (blood work) drawn today and your tests are completely normal, you will receive your results only by: ?MyChart Message (if you have MyChart) OR ?A paper copy in the mail ?If you have any lab test that is abnormal or we need to change your treatment, we will call you to review the results. ? ? ?Testing/Procedures: ?None ordered ? ? ?Follow-Up: ?At [facility], you and your health needs are our priority.  As part of our continuing mission to provide you with exceptional heart care, we have created designated Provider Care Teams.  These Care Teams include your primary Cardiologist (physician) and Advanced Practice Providers (APPs -  Physician Assistants and Nurse Practitioners) who all work together to provide you with the care you need, when you need it. ? ?We recommend signing up for the patient portal called "MyChart".  Sign up information is provided on this After Visit Summary.  MyChart is used to connect with patients for Virtual Visits (Telemedicine).  Patients are able to view lab/test results, encounter notes, upcoming appointments, etc.  Non-urgent messages can be sent to your provider as well.   ?To learn more about what you can do with MyChart, go to [email or web address].   ? ?Your next appointment:   ?6 month(s) ? ?The format for your next appointment:   ?In Person ? ?Provider:   ?You may see Kate Sable, MD or one of the following Advanced Practice Providers on your designated Care Team:   ?Murray Hodgkins, NP ?Christell Faith, PA-C ?Cadence Kathlen Mody, PA-C  ? ? ?Other Instructions ? ? ?
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
## 2022-01-22 ENCOUNTER — Other Ambulatory Visit: Payer: Self-pay | Admitting: Family Medicine

## 2022-01-23 NOTE — Telephone Encounter (Signed)
Requested medication (s) are due for refill today: yes ? ?Requested medication (s) are on the active medication list: yes ? ?Last refill:  12/14/20 #90/2 ? ?Future visit scheduled: yes ? ?Notes to clinic:  Unable to refill per protocol due to failed labs, no updated results. ? ? ?  ?Requested Prescriptions  ?Pending Prescriptions Disp Refills  ? finasteride (PROSCAR) 5 MG tablet [Pharmacy Med Name: FINASTERIDE 5 MG TABLET] 30 tablet   ?  Sig: TAKE ONE TABLET BY MOUTH DAILY  ?  ? Urology: 5-alpha Reductase Inhibitors Failed - 01/22/2022  2:21 PM  ?  ?  Failed - PSA in normal range and within 360 days  ?  PSA  ?Date Value Ref Range Status  ?08/20/2017 1.3 < OR = 4.0 ng/mL Final  ?  Comment:  ?  The total PSA value from this assay system is  ?standardized against the WHO standard. The test  ?result will be approximately 20% lower when compared  ?to the equimolar-standardized total PSA (Beckman  ?Coulter). Comparison of serial PSA results should be  ?interpreted with this fact in mind. ?. ?This test was performed using the Siemens  ?chemiluminescent method. Values obtained from  ?different assay methods cannot be used ?interchangeably. PSA levels, regardless of ?value, should not be interpreted as absolute ?evidence of the presence or absence of disease. ?  ?01/21/2012 2.2  Final  ? ?Prostate Specific Ag, Serum  ?Date Value Ref Range Status  ?09/30/2020 2.1 0.0 - 4.0 ng/mL Final  ?  Comment:  ?  Roche ECLIA methodology. ?According to the American Urological Association, Serum PSA should ?decrease and remain at undetectable levels after radical ?prostatectomy. The AUA defines biochemical recurrence as an initial ?PSA value 0.2 ng/mL or greater followed by a subsequent confirmatory ?PSA value 0.2 ng/mL or greater. ?Values obtained with different assay methods or kits cannot be used ?interchangeably. Results cannot be interpreted as absolute evidence ?of the presence or absence of malignant disease. ?  ?  ?  ?  ?  Passed -  Valid encounter within last 12 months  ?  Recent Outpatient Visits   ? ?      ? 1 month ago Annual physical exam  ? [facility] Birdie Sons, MD  ? 1 year ago Annual physical exam  ? [facility] Birdie Sons, MD  ? 2 years ago Annual physical exam  ? [facility] Birdie Sons, MD  ? 3 years ago Annual physical exam  ? [facility] Birdie Sons, MD  ? 4 years ago Annual physical exam  ? [facility] Birdie Sons, MD  ? ?  ?  ?Future Appointments   ? ?        ? In 5 months Agbor-Etang, Aaron Edelman, MD [facility]  ? In 10 months Fisher, Kirstie Peri, MD [facility]  ? ?  ? ?  ?  ?  ? ?

## 2022-02-01 ENCOUNTER — Other Ambulatory Visit: Payer: Self-pay | Admitting: Family Medicine

## 2022-02-08 DIAGNOSIS — H2513 Age-related nuclear cataract, bilateral: Secondary | ICD-10-CM | POA: Diagnosis not present

## 2022-02-14 ENCOUNTER — Telehealth: Payer: Self-pay | Admitting: *Deleted

## 2022-02-14 DIAGNOSIS — E7800 Pure hypercholesterolemia, unspecified: Secondary | ICD-10-CM

## 2022-02-14 MED ORDER — PRAVASTATIN SODIUM 80 MG PO TABS: 80.0000 mg | ORAL_TABLET | Freq: Every day | ORAL | 3 refills | Status: DC

## 2022-02-14 NOTE — Telephone Encounter (Signed)
Copied from [place] [phone]. Topic: General - Other ?>> Feb 13, 2022  2:40 PM P[place]lus, Brayton Layman A wrote: ?Reason for CRM: Pt called in requesting a 90 day supply with 3 refills of pravastatin (PRAVACHOL) 80 MG tablet. Pt stated he was only given a 30 day supply with 3 refills,  please advise. ?

## 2022-04-02 DIAGNOSIS — M7542 Impingement syndrome of left shoulder: Secondary | ICD-10-CM | POA: Diagnosis not present

## 2022-04-02 DIAGNOSIS — M7541 Impingement syndrome of right shoulder: Secondary | ICD-10-CM | POA: Diagnosis not present

## 2022-07-20 ENCOUNTER — Ambulatory Visit: Payer: BC Managed Care – PPO | Attending: Cardiology | Admitting: Cardiology

## 2022-07-20 ENCOUNTER — Encounter: Payer: Self-pay | Admitting: Cardiology

## 2022-07-20 VITALS — BP 140/88 | HR 89 | Ht 73.75 in | Wt 228.8 lb

## 2022-07-20 DIAGNOSIS — E7800 Pure hypercholesterolemia, unspecified: Secondary | ICD-10-CM

## 2022-07-20 DIAGNOSIS — I10 Essential (primary) hypertension: Secondary | ICD-10-CM

## 2022-07-20 MED ORDER — AMLODIPINE BESYLATE 10 MG PO TABS: 10.0000 mg | ORAL_TABLET | Freq: Every day | ORAL | 5 refills | Status: DC

## 2022-07-20 NOTE — Patient Instructions (Signed)
Medication Instructions:   Your physician has recommended you make the following change in your medication:    INCREASE your Amlodipine to 10 MG once a day.  *If you need a refill on your cardiac medications before your next appointment, please call your pharmacy*    Follow-Up: At [facility], you and your health needs are our priority.  As part of our continuing mission to provide you with exceptional heart care, we have created designated Provider Care Teams.  These Care Teams include your primary Cardiologist (physician) and Advanced Practice Providers (APPs -  Physician Assistants and Nurse Practitioners) who all work together to provide you with the care you need, when you need it.  We recommend signing up for the patient portal called "MyChart".  Sign up information is provided on this After Visit Summary.  MyChart is used to connect with patients for Virtual Visits (Telemedicine).  Patients are able to view lab/test results, encounter notes, upcoming appointments, etc.  Non-urgent messages can be sent to your provider as well.   To learn more about what you can do with MyChart, go to [email or web address].    Your next appointment:   3 month(s)  The format for your next appointment:   In Person  Provider:   You may see Kate Sable, MD or one of the following Advanced Practice Providers on your designated Care Team:   Murray Hodgkins, NP Christell Faith, PA-C Cadence Kathlen Mody, PA-C Gerrie Nordmann, NP    Other Instructions   Important Information About Sugar
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
## 2022-07-26 NOTE — Addendum Note (Signed)
Addended by: Nestor Ramp on: 07/26/2022 03:05 PM   Modules accepted: Orders

## 2022-08-01 DIAGNOSIS — M19011 Primary osteoarthritis, right shoulder: Secondary | ICD-10-CM | POA: Diagnosis not present

## 2022-08-01 DIAGNOSIS — M19012 Primary osteoarthritis, left shoulder: Secondary | ICD-10-CM | POA: Diagnosis not present

## 2022-08-06 ENCOUNTER — Other Ambulatory Visit: Payer: Self-pay | Admitting: Family Medicine

## 2022-08-06 NOTE — Telephone Encounter (Signed)
[facility] [facility] faxed refill request for the following medications:  finasteride (PROSCAR) 5 MG tablet    Please advise

## 2022-08-07 MED ORDER — FINASTERIDE 5 MG PO TABS: 5.0000 mg | ORAL_TABLET | Freq: Every day | ORAL | 1 refills | Status: DC

## 2022-08-14 DIAGNOSIS — Z86018 Personal history of other benign neoplasm: Secondary | ICD-10-CM | POA: Diagnosis not present

## 2022-08-14 DIAGNOSIS — Z872 Personal history of diseases of the skin and subcutaneous tissue: Secondary | ICD-10-CM | POA: Diagnosis not present

## 2022-08-14 DIAGNOSIS — L578 Other skin changes due to chronic exposure to nonionizing radiation: Secondary | ICD-10-CM | POA: Diagnosis not present

## 2022-08-14 DIAGNOSIS — L570 Actinic keratosis: Secondary | ICD-10-CM | POA: Diagnosis not present

## 2022-09-24 ENCOUNTER — Other Ambulatory Visit: Payer: Self-pay

## 2022-09-24 MED ORDER — IRBESARTAN 300 MG PO TABS: 300.0000 mg | ORAL_TABLET | Freq: Every day | ORAL | 3 refills | Status: DC

## 2022-10-17 ENCOUNTER — Other Ambulatory Visit: Payer: Self-pay | Admitting: Family Medicine

## 2022-10-17 NOTE — Telephone Encounter (Signed)
Medication Refill - Medication: pantoprazole (PROTONIX) 40 MG tablet   Has the patient contacted their pharmacy? Yes.     Preferred Pharmacy (with phone number or street name):  [facility] [id number], [place] Phone: [phone]  Fax: [phone]     Has the patient been seen for an appointment in the last year OR does the patient have an upcoming appointment? Yes.    Please assist patient further as he has 3 pills or days left.

## 2022-10-19 MED ORDER — PANTOPRAZOLE SODIUM 40 MG PO TBEC: DELAYED_RELEASE_TABLET | ORAL | 1 refills | Status: DC

## 2022-10-24 ENCOUNTER — Ambulatory Visit: Payer: BC Managed Care – PPO | Attending: Cardiology | Admitting: Cardiology

## 2022-10-24 ENCOUNTER — Encounter: Payer: Self-pay | Admitting: Cardiology

## 2022-10-24 VITALS — BP 128/74 | HR 94 | Ht 74.0 in | Wt 229.8 lb

## 2022-10-24 DIAGNOSIS — I10 Essential (primary) hypertension: Secondary | ICD-10-CM

## 2022-10-24 DIAGNOSIS — E7800 Pure hypercholesterolemia, unspecified: Secondary | ICD-10-CM | POA: Diagnosis not present

## 2022-10-24 NOTE — Patient Instructions (Signed)
Medication Instructions:   Your physician recommends that you continue on your current medications as directed. Please refer to the Current Medication list given to you today.  *If you need a refill on your cardiac medications before your next appointment, please call your pharmacy*   Lab Work:  None Ordered  If you have labs (blood work) drawn today and your tests are completely normal, you will receive your results only by: MyChart Message (if you have MyChart) OR A paper copy in the mail If you have any lab test that is abnormal or we need to change your treatment, we will call you to review the results.   Testing/Procedures:  None Ordered   Follow-Up: At [facility], you and your health needs are our priority.  As part of our continuing mission to provide you with exceptional heart care, we have created designated Provider Care Teams.  These Care Teams include your primary Cardiologist (physician) and Advanced Practice Providers (APPs -  Physician Assistants and Nurse Practitioners) who all work together to provide you with the care you need, when you need it.  We recommend signing up for the patient portal called "MyChart".  Sign up information is provided on this After Visit Summary.  MyChart is used to connect with patients for Virtual Visits (Telemedicine).  Patients are able to view lab/test results, encounter notes, upcoming appointments, etc.  Non-urgent messages can be sent to your provider as well.   To learn more about what you can do with MyChart, go to [email or web address].    Your next appointment:    AS NEEDED  
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
## 2022-10-31 ENCOUNTER — Ambulatory Visit: Payer: BC Managed Care – PPO | Admitting: Physician Assistant

## 2022-10-31 ENCOUNTER — Encounter: Payer: Self-pay | Admitting: Physician Assistant

## 2022-10-31 VITALS — BP 139/78 | HR 86 | Ht 74.0 in | Wt 232.1 lb

## 2022-10-31 DIAGNOSIS — J0110 Acute frontal sinusitis, unspecified: Secondary | ICD-10-CM | POA: Diagnosis not present

## 2022-10-31 MED ORDER — AZELASTINE HCL 0.1 % NA SOLN: 1.0000 | Freq: Two times a day (BID) | NASAL | 1 refills | Status: AC

## 2022-10-31 MED ORDER — AMOXICILLIN-POT CLAVULANATE 875-125 MG PO TABS: 1.0000 | ORAL_TABLET | Freq: Two times a day (BID) | ORAL | 0 refills | Status: AC

## 2022-10-31 NOTE — Progress Notes (Signed)
I,Sha'taria Tyson,acting as a Education administrator for [employer], PA-C.,have documented all relevant documentation on the behalf of Mikey Kirschner, PA-C,as directed by  Mikey Kirschner, PA-C while in the presence of Mikey Kirschner, PA-C.   Established patient visit   Patient: Ronnie [name]   DOB: [birth year]   67 y.o. Male  MRN: [id number] Visit Date: 10/31/2022  Today's healthcare provider: Mikey Kirschner, PA-C   Cc. Sinus congestion, right ear diminished hearing  Subjective     Pt reports sinus pressure, nasal congestion, a dull ache in his right ear and diminished hearing x 6-7 days. Reports a negative covid test 2 days ago at home. Denies fevers, chills, sob, wheezing.  Medications: Outpatient Medications Prior to Visit  Medication Sig   amLODipine (NORVASC) 10 MG tablet Take 1 tablet (10 mg total) by mouth daily.   ASCORBIC ACID PO Take 1 tablet by mouth daily.   aspirin 81 MG tablet Take 1 tablet by mouth daily.   cholecalciferol (VITAMIN D) 1000 UNITS tablet Take 1 tablet by mouth daily.   Coenzyme Q10 (CO Q 10) 100 MG CAPS Take 1 capsule by mouth daily.   Echinacea 350 MG CAPS Take 1 capsule by mouth daily.   fexofenadine (ALLEGRA) 180 MG tablet Take 1 tablet by mouth daily.   finasteride (PROSCAR) 5 MG tablet Take 1 tablet (5 mg total) by mouth daily.   ibuprofen (ADVIL,MOTRIN) 400 MG tablet Take 400 mg by mouth every 4 (four) hours as needed. for pain   irbesartan (AVAPRO) 300 MG tablet Take 1 tablet (300 mg total) by mouth daily.   levothyroxine (SYNTHROID) 200 MCG tablet TAKE ONE TABLET BY MOUTH EVERY MORNING ON AN EMPTY STOMACH   pantoprazole (PROTONIX) 40 MG tablet TAKE ONE TABLET BY MOUTH ONE TIME DAILY FOR REFLUX   pravastatin (PRAVACHOL) 80 MG tablet Take 1 tablet (80 mg total) by mouth daily.   sildenafil (REVATIO) 20 MG tablet Take by mouth as needed.   Zinc 50 MG CAPS Take 1 capsule by mouth daily.   No facility-administered medications prior to visit.     Review of Systems  HENT:  Positive for congestion, sinus pain and sore throat.      Objective    Blood pressure 139/78, pulse 86, height '6\' 2"'$  (1.88 m), weight 232 lb 1.6 oz (105.3 kg), SpO2 98 %.   Physical Exam Constitutional:      General: He is awake.     Appearance: He is well-developed.  HENT:     Head: Normocephalic.     Ears:     Comments: Mid ear effusion bilaterally without erythema or injection    Nose: Congestion present.     Mouth/Throat:     Pharynx: Posterior oropharyngeal erythema present. No oropharyngeal exudate.     Comments: Small black lesion hard palate pt states is a piece of lead, since 67 years old Eyes:     Conjunctiva/sclera: Conjunctivae normal.  Cardiovascular:     Rate and Rhythm: Normal rate.  Pulmonary:     Effort: Pulmonary effort is normal.     Breath sounds: Normal breath sounds.  Skin:    General: Skin is warm.  Neurological:     Mental Status: He is alert and oriented to person, place, and time.  Psychiatric:        Attention and Perception: Attention normal.        Mood and Affect: Mood normal.        Speech: Speech normal.  Behavior: Behavior is cooperative.      No results found for any visits on 10/31/22.  Assessment & Plan     Acute sinusitis Increase fluids Rx azelastine nasal spray Rx augmentin 875 mg bid x 7 days   Return if symptoms worsen or fail to improve.      I, Mikey Kirschner, PA-C have reviewed all documentation for this visit. The documentation on  10/31/22 for the exam, diagnosis, procedures, and orders are all accurate and complete.  Mikey Kirschner, PA-C [facility] [address] [place], [place], [address] Office: [phone] Fax: [place]

## 2022-11-27 ENCOUNTER — Telehealth: Payer: Self-pay | Admitting: Family Medicine

## 2022-11-27 NOTE — Telephone Encounter (Signed)
Copied from [place] [id number]. Topic: Appointment Scheduling - Scheduling Inquiry for Clinic >> Nov 27, 2022 11:03 AM Ludger Nutting wrote: Patient was contacted to reschedule his appointment on 12/18/22. Next available Physical appointment was 3/29. Patient states that he needs to complete his physical before the end of February for his insurance. Can patient be worked into providers schedule? Please follow up with patient.

## 2022-11-27 NOTE — Telephone Encounter (Signed)
That's fine, he can have any open slots this month.

## 2022-11-27 NOTE — Telephone Encounter (Signed)
Appt scheduled

## 2022-12-17 ENCOUNTER — Ambulatory Visit (INDEPENDENT_AMBULATORY_CARE_PROVIDER_SITE_OTHER): Payer: BC Managed Care – PPO | Admitting: Family Medicine

## 2022-12-17 ENCOUNTER — Other Ambulatory Visit: Payer: Self-pay | Admitting: Family Medicine

## 2022-12-17 VITALS — BP 115/73 | HR 58 | Temp 98.6°F | Ht 74.0 in | Wt 177.0 lb

## 2022-12-17 DIAGNOSIS — Z0000 Encounter for general adult medical examination without abnormal findings: Secondary | ICD-10-CM | POA: Diagnosis not present

## 2022-12-17 DIAGNOSIS — E7800 Pure hypercholesterolemia, unspecified: Secondary | ICD-10-CM | POA: Diagnosis not present

## 2022-12-17 DIAGNOSIS — I10 Essential (primary) hypertension: Secondary | ICD-10-CM

## 2022-12-17 DIAGNOSIS — Z125 Encounter for screening for malignant neoplasm of prostate: Secondary | ICD-10-CM

## 2022-12-17 DIAGNOSIS — E039 Hypothyroidism, unspecified: Secondary | ICD-10-CM | POA: Diagnosis not present

## 2022-12-17 DIAGNOSIS — K219 Gastro-esophageal reflux disease without esophagitis: Secondary | ICD-10-CM

## 2022-12-17 DIAGNOSIS — E559 Vitamin D deficiency, unspecified: Secondary | ICD-10-CM | POA: Diagnosis not present

## 2022-12-17 DIAGNOSIS — Z23 Encounter for immunization: Secondary | ICD-10-CM
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
## 2022-12-17 NOTE — Patient Instructions (Signed)
.   Please review the attached list of medications and notify my office if there are any errors.   The CDC recommends two doses of Shingrix (the shingles vaccine) separated by 2 to 6 months for adults age 68 years and older. I recommend checking with your pharmacy plan regarding coverage for this vaccine.  .    

## 2022-12-18 ENCOUNTER — Encounter: Payer: BC Managed Care – PPO | Admitting: Family Medicine

## 2022-12-18 DIAGNOSIS — E7800 Pure hypercholesterolemia, unspecified: Secondary | ICD-10-CM | POA: Diagnosis not present

## 2022-12-18 DIAGNOSIS — I10 Essential (primary) hypertension: Secondary | ICD-10-CM | POA: Diagnosis not present

## 2022-12-18 DIAGNOSIS — E039 Hypothyroidism, unspecified: Secondary | ICD-10-CM | POA: Diagnosis not present

## 2022-12-18 DIAGNOSIS — E559 Vitamin D deficiency, unspecified: Secondary | ICD-10-CM | POA: Diagnosis not present

## 2022-12-18 NOTE — Telephone Encounter (Signed)
Requested Prescriptions  Pending Prescriptions Disp Refills   levothyroxine (SYNTHROID) 200 MCG tablet [Pharmacy Med Name: LEVOTHYROXINE 200 MCG TABLET] 30 tablet 0    Sig: TAKE ONE TABLET BY MOUTH EVERY MORNING ON AN EMPTY STOMACH     Endocrinology:  Hypothyroid Agents Failed - 12/17/2022  6:22 AM      Failed - TSH in normal range and within 360 days    TSH  Date Value Ref Range Status  12/20/2021 4.730 (H) 0.450 - 4.500 uIU/mL Final         Failed - Valid encounter within last 12 months    Recent Outpatient Visits           Yesterday Annual physical exam   [facility] Birdie Sons, MD   1 month ago Acute non-recurrent frontal sinusitis   [place] Mikey Kirschner, PA-C   1 year ago Annual physical exam   [facility] Birdie Sons, MD   2 years ago Annual physical exam   [facility] Birdie Sons, MD   3 years ago Annual physical exam   [facility] Birdie Sons, MD

## 2022-12-19 LAB — CBC
Hematocrit: 43.3 % (ref 37.5–51.0)
Hemoglobin: 14.4 g/dL (ref 13.0–17.7)
MCH: 31.2 pg (ref 26.6–33.0)
MCHC: 33.3 g/dL (ref 31.5–35.7)
MCV: 94 fL (ref 79–97)
Platelets: 325 10*3/uL (ref 150–450)
RBC: 4.62 x10E6/uL (ref 4.14–5.80)
RDW: 12.4 % (ref 11.6–15.4)
WBC: 6.5 10*3/uL (ref 3.4–10.8)

## 2022-12-19 LAB — COMPREHENSIVE METABOLIC PANEL
ALT: 23 IU/L (ref 0–44)
AST: 25 IU/L (ref 0–40)
Albumin/Globulin Ratio: 1.8 (ref 1.2–2.2)
Albumin: 4.4 g/dL (ref 3.9–4.9)
Alkaline Phosphatase: 58 IU/L (ref 44–121)
BUN/Creatinine Ratio: 16 (ref 10–24)
BUN: 16 mg/dL (ref 8–27)
Bilirubin Total: 0.4 mg/dL (ref 0.0–1.2)
CO2: 22 mmol/L (ref 20–29)
Calcium: 9.9 mg/dL (ref 8.6–10.2)
Chloride: 105 mmol/L (ref 96–106)
Creatinine, Ser: 1.03 mg/dL (ref 0.76–1.27)
Globulin, Total: 2.5 g/dL (ref 1.5–4.5)
Glucose: 103 mg/dL — ABNORMAL HIGH (ref 70–99)
Potassium: 4.7 mmol/L (ref 3.5–5.2)
Sodium: 141 mmol/L (ref 134–144)
Total Protein: 6.9 g/dL (ref 6.0–8.5)
eGFR: 80 mL/min/{1.73_m2} (ref >59)

## 2022-12-19 LAB — T4, FREE: Free T4: 1.64 ng/dL (ref 0.82–1.77)

## 2022-12-19 LAB — MAGNESIUM: Magnesium: 2.1 mg/dL (ref 1.6–2.3)

## 2022-12-19 LAB — LIPID PANEL
Chol/HDL Ratio: 3.3 ratio (ref 0.0–5.0)
Cholesterol, Total: 157 mg/dL (ref 100–199)
HDL: 48 mg/dL (ref >39)
LDL Chol Calc (NIH): 91 mg/dL (ref 0–99)
Triglycerides: 97 mg/dL (ref 0–149)
VLDL Cholesterol Cal: 18 mg/dL (ref 5–40)

## 2022-12-19 LAB — PSA TOTAL (REFLEX TO FREE): Prostate Specific Ag, Serum: 3.6 ng/mL (ref 0.0–4.0)

## 2022-12-19 LAB — TSH: TSH: 2.22 u[IU]/mL (ref 0.450–4.500)

## 2022-12-19 LAB — VITAMIN D 25 HYDROXY (VIT D DEFICIENCY, FRACTURES): Vit D, 25-Hydroxy: 31.1 ng/mL (ref 30.0–100.0)

## 2022-12-25 ENCOUNTER — Telehealth: Payer: Self-pay

## 2022-12-25 NOTE — Telephone Encounter (Signed)
Copied from [place] [id number]. Topic: General - Other >> Dec 25, 2022 10:12 AM Sabas Sous wrote: Reason for CRM: Pt called to report that he brought a form during his recent appt that needs to be submitted to his job. He wants to retrieve a copy to present to HR because he has been told that they have not received it from his PCP.   Best contact: [phone]

## 2022-12-25 NOTE — Telephone Encounter (Signed)
Papers faxed in and patient notified

## 2023-01-04 DIAGNOSIS — M7542 Impingement syndrome of left shoulder: Secondary | ICD-10-CM | POA: Diagnosis not present

## 2023-01-04 DIAGNOSIS — M7541 Impingement syndrome of right shoulder: Secondary | ICD-10-CM | POA: Diagnosis not present

## 2023-01-14 ENCOUNTER — Other Ambulatory Visit: Payer: Self-pay

## 2023-01-14 MED ORDER — AMLODIPINE BESYLATE 10 MG PO TABS: 10.0000 mg | ORAL_TABLET | Freq: Every day | ORAL | 8 refills | Status: DC

## 2023-01-17 ENCOUNTER — Other Ambulatory Visit: Payer: Self-pay | Admitting: Family Medicine

## 2023-02-14 DIAGNOSIS — H2513 Age-related nuclear cataract, bilateral: Secondary | ICD-10-CM | POA: Diagnosis not present

## 2023-02-14 DIAGNOSIS — H43813 Vitreous degeneration, bilateral: Secondary | ICD-10-CM | POA: Diagnosis not present

## 2023-02-21 ENCOUNTER — Other Ambulatory Visit: Payer: Self-pay | Admitting: Family Medicine

## 2023-02-21 DIAGNOSIS — E7800 Pure hypercholesterolemia, unspecified: Secondary | ICD-10-CM

## 2023-04-02 ENCOUNTER — Ambulatory Visit: Payer: BC Managed Care – PPO | Admitting: Family Medicine

## 2023-04-02 VITALS — BP 115/65 | HR 84 | Temp 98.2°F | Resp 12 | Wt 218.0 lb

## 2023-04-02 DIAGNOSIS — R519 Headache, unspecified: Secondary | ICD-10-CM | POA: Diagnosis not present

## 2023-04-02 DIAGNOSIS — R5383 Other fatigue: Secondary | ICD-10-CM

## 2023-04-02 NOTE — Progress Notes (Signed)
I,Sulibeya S Dimas,acting as a scribe for Mila Merry, MD.,have documented all relevant documentation on the behalf of Mila Merry, MD,as directed by  Mila Merry, MD while in the presence of Mila Merry, MD.     Established patient visit   Patient: Ronnie [name]   DOB: Nov 09, 1955   67 y.o. Male  MRN: [id number] Visit Date: 04/02/2023  Today's healthcare provider: Mila Merry, MD   Chief Complaint  Patient presents with   Insect Bite   Subjective    HPI  Discussed the use of AI scribe software for clinical note transcription with the patient, who gave verbal consent to proceed.  History of Present Illness   The patient presents with a history of a tick bite three weeks ago, which has resulted in persistent redness at the site of the bite. He reports feeling lethargic and experiencing headaches, which began a few days after the tick bite. He describes the headaches as being located above the sinuses and distinct from sinus-related headaches he has experienced in the past. He also reports feeling unusually tired and not feeling like himself. He has not noticed any significant joint aches beyond what he considers normal. He also reports a skin issue that started three days ago, similar to a condition he experienced nine months ago, which resolved on its own after a few days. He has been sleeping poorly, which he attributes in part to his wife being away.   He also has large painless vesicular lesion on dorsum of right third finger which is a bit itchy. He had similar lesion in the past which resolved on its own after a few weeks.      He brings in tick which he has kept in a medicine bottle, which appears to be a deer tick.  Medications: Outpatient Medications Prior to Visit  Medication Sig   amLODipine (NORVASC) 10 MG tablet Take 1 tablet (10 mg total) by mouth daily.   ASCORBIC ACID PO Take 1 tablet by mouth daily.   aspirin 81 MG tablet Take 1 tablet by mouth daily.    azelastine (ASTELIN) 0.1 % nasal spray Place 1 spray into both nostrils 2 (two) times daily. Use in each nostril as directed   cholecalciferol (VITAMIN D) 1000 UNITS tablet Take 1 tablet by mouth daily.   Coenzyme Q10 (CO Q 10) 100 MG CAPS Take 1 capsule by mouth daily.   Echinacea 350 MG CAPS Take 1 capsule by mouth daily.   fexofenadine (ALLEGRA) 180 MG tablet Take 1 tablet by mouth daily.   finasteride (PROSCAR) 5 MG tablet Take 1 tablet (5 mg total) by mouth daily.   ibuprofen (ADVIL,MOTRIN) 400 MG tablet Take 400 mg by mouth every 4 (four) hours as needed. for pain   irbesartan (AVAPRO) 300 MG tablet Take 1 tablet (300 mg total) by mouth daily.   levothyroxine (SYNTHROID) 200 MCG tablet TAKE ONE TABLET BY MOUTH EVERY MORNING ON AN EMPTY STOMACH   pantoprazole (PROTONIX) 40 MG tablet TAKE ONE TABLET BY MOUTH ONE TIME DAILY FOR REFLUX   pravastatin (PRAVACHOL) 80 MG tablet TAKE ONE TABLET BY MOUTH DAILY   sildenafil (REVATIO) 20 MG tablet Take by mouth as needed.   Zinc 50 MG CAPS Take 1 capsule by mouth daily.   No facility-administered medications prior to visit.    Review of Systems  Constitutional:  Positive for diaphoresis and fatigue. Negative for chills and fever.  Respiratory:  Negative for cough and shortness of breath.  Cardiovascular:  Negative for chest pain.  Neurological:  Positive for headaches.       Objective    BP 115/65 (BP Location: Left Arm, Patient Position: Sitting, Cuff Size: Large)   Pulse 84   Temp 98.2 F (36.8 C) (Temporal)   Resp 12   Wt 218 lb (98.9 kg)   SpO2 98%   BMI 27.99 kg/m    Physical Exam  Physical Exam   SKIN: Area of tick bite with tiny slightly raised erythematous papule with no surrounding erythema.       Assessment & Plan     Assessment and Plan    Tick Bite: Persistent erythema at the site of a tick bite three weeks ago. No signs of skin infection. Patient reports feeling lethargic and having headaches. Need to rule out  Lyme disease. -Order Lyme disease serology and complete blood count.   Skin Lesion: Recurrent lesion with clear discharge. No signs of infection. Likely allergic reaction. -Apply hydrocortisone cream. -Consider warm water soaks to help draw out fluid.     Check TSH due to history of hypothyroid.        The entirety of the information documented in the History of Present Illness, Review of Systems and Physical Exam were personally obtained by me. Portions of this information were initially documented by the CMA and reviewed by me for thoroughness and accuracy.     Mila Merry, MD  [facility] [phone] (phone) [phone] (fax)  [facility]

## 2023-04-03 LAB — CBC WITH DIFFERENTIAL/PLATELET
Basophils Absolute: 0.1 10*3/uL (ref 0.0–0.2)
Basos: 2 %
EOS (ABSOLUTE): 0.1 10*3/uL (ref 0.0–0.4)
Eos: 3 %
Hematocrit: 43.7 % (ref 37.5–51.0)
Hemoglobin: 14.5 g/dL (ref 13.0–17.7)
Immature Grans (Abs): 0 10*3/uL (ref 0.0–0.1)
Immature Granulocytes: 0 %
Lymphocytes Absolute: 1.2 10*3/uL (ref 0.7–3.1)
Lymphs: 23 %
MCH: 30.5 pg (ref 26.6–33.0)
MCHC: 33.2 g/dL (ref 31.5–35.7)
MCV: 92 fL (ref 79–97)
Monocytes Absolute: 0.6 10*3/uL (ref 0.1–0.9)
Monocytes: 11 %
Neutrophils Absolute: 3.3 10*3/uL (ref 1.4–7.0)
Neutrophils: 61 %
Platelets: 327 10*3/uL (ref 150–450)
RBC: 4.76 x10E6/uL (ref 4.14–5.80)
RDW: 12.7 % (ref 11.6–15.4)
WBC: 5.3 10*3/uL (ref 3.4–10.8)

## 2023-04-03 LAB — COMPREHENSIVE METABOLIC PANEL
ALT: 26 IU/L (ref 0–44)
AST: 27 IU/L (ref 0–40)
Albumin/Globulin Ratio: 1.8
Albumin: 4.5 g/dL (ref 3.9–4.9)
Alkaline Phosphatase: 58 IU/L (ref 44–121)
BUN/Creatinine Ratio: 21 (ref 10–24)
BUN: 20 mg/dL (ref 8–27)
Bilirubin Total: 0.5 mg/dL (ref 0.0–1.2)
CO2: 22 mmol/L (ref 20–29)
Calcium: 10.4 mg/dL — ABNORMAL HIGH (ref 8.6–10.2)
Chloride: 103 mmol/L (ref 96–106)
Creatinine, Ser: 0.97 mg/dL (ref 0.76–1.27)
Globulin, Total: 2.5 g/dL (ref 1.5–4.5)
Glucose: 71 mg/dL (ref 70–99)
Potassium: 4.9 mmol/L (ref 3.5–5.2)
Sodium: 140 mmol/L (ref 134–144)
Total Protein: 7 g/dL (ref 6.0–8.5)
eGFR: 86 mL/min/{1.73_m2} (ref >59)

## 2023-04-03 LAB — TSH: TSH: 2.17 u[IU]/mL (ref 0.450–4.500)

## 2023-04-03 LAB — LYME DISEASE SEROLOGY W/REFLEX: Lyme Total Antibody EIA: NEGATIVE

## 2023-06-01 ENCOUNTER — Other Ambulatory Visit: Payer: Self-pay | Admitting: Family Medicine

## 2023-06-03 NOTE — Telephone Encounter (Signed)
Requested Prescriptions  Pending Prescriptions Disp Refills   finasteride (PROSCAR) 5 MG tablet [Pharmacy Med Name: FINASTERIDE 5 MG TABLET] 90 tablet 1    Sig: TAKE 1 TABLET BY MOUTH DAILY     Urology: 5-alpha Reductase Inhibitors Passed - 06/01/2023  7:49 AM      Passed - PSA in normal range and within 360 days    PSA  Date Value Ref Range Status  08/20/2017 1.3 < OR = 4.0 ng/mL Final    Comment:    The total PSA value from this assay system is  standardized against the WHO standard. The test  result will be approximately 20% lower when compared  to the equimolar-standardized total PSA (Beckman  Coulter). Comparison of serial PSA results should be  interpreted with this fact in mind. . This test was performed using the Siemens  chemiluminescent method. Values obtained from  different assay methods cannot be used interchangeably. PSA levels, regardless of value, should not be interpreted as absolute evidence of the presence or absence of disease.   01/21/2012 2.2  Final   Prostate Specific Ag, Serum  Date Value Ref Range Status  12/18/2022 3.6 0.0 - 4.0 ng/mL Final    Comment:    Roche ECLIA methodology. According to the American Urological Association, Serum PSA should decrease and remain at undetectable levels after radical prostatectomy. The AUA defines biochemical recurrence as an initial PSA value 0.2 ng/mL or greater followed by a subsequent confirmatory PSA value 0.2 ng/mL or greater. Values obtained with different assay methods or kits cannot be used interchangeably. Results cannot be interpreted as absolute evidence of the presence or absence of malignant disease.          Passed - Valid encounter within last 12 months    Recent Outpatient Visits           2 months ago Other fatigue   [facility] Malva Limes, MD   5 months ago Annual physical exam   [facility] Malva Limes, MD   7 months ago  Acute non-recurrent frontal sinusitis   [facility] Alfredia Ferguson, PA-C   1 year ago Annual physical exam   [facility] Malva Limes, MD   2 years ago Annual physical exam   [facility] Malva Limes, MD               pantoprazole (PROTONIX) 40 MG tablet [Pharmacy Med Name: PANTOPRAZOLE SOD DR 40 MG TAB] 90 tablet 1    Sig: TAKE 1 TABLET BY MOUTH DAILY FOR REFLUX     Gastroenterology: Proton Pump Inhibitors Passed - 06/01/2023  7:49 AM      Passed - Valid encounter within last 12 months    Recent Outpatient Visits           2 months ago Other fatigue   [facility] Malva Limes, MD   5 months ago Annual physical exam   [facility] Malva Limes, MD   7 months ago Acute non-recurrent frontal sinusitis   [facility] Alfredia Ferguson, PA-C   1 year ago Annual physical exam   [facility] Malva Limes, MD   2 years ago Annual physical exam   [facility] Malva Limes, MD

## 2023-06-21 DIAGNOSIS — M7542 Impingement syndrome of left shoulder: Secondary | ICD-10-CM | POA: Diagnosis not present

## 2023-06-21 DIAGNOSIS — M7541 Impingement syndrome of right shoulder: Secondary | ICD-10-CM | POA: Diagnosis not present

## 2023-08-15 DIAGNOSIS — Z86018 Personal history of other benign neoplasm: Secondary | ICD-10-CM | POA: Diagnosis not present

## 2023-08-15 DIAGNOSIS — Z872 Personal history of diseases of the skin and subcutaneous tissue: Secondary | ICD-10-CM | POA: Diagnosis not present

## 2023-08-15 DIAGNOSIS — L578 Other skin changes due to chronic exposure to nonionizing radiation: Secondary | ICD-10-CM | POA: Diagnosis not present

## 2023-08-28 DIAGNOSIS — H903 Sensorineural hearing loss, bilateral: Secondary | ICD-10-CM | POA: Diagnosis not present

## 2023-09-03 ENCOUNTER — Encounter: Payer: Self-pay | Admitting: Urology

## 2023-09-03 ENCOUNTER — Ambulatory Visit: Payer: BC Managed Care – PPO | Admitting: Urology

## 2023-09-03 VITALS — BP 160/91 | HR 91 | Ht 72.0 in | Wt 220.0 lb

## 2023-09-03 DIAGNOSIS — R972 Elevated prostate specific antigen [PSA]: Secondary | ICD-10-CM | POA: Diagnosis not present

## 2023-09-03 DIAGNOSIS — N529 Male erectile dysfunction, unspecified: Secondary | ICD-10-CM | POA: Diagnosis not present

## 2023-09-03 DIAGNOSIS — N401 Enlarged prostate with lower urinary tract symptoms: Secondary | ICD-10-CM

## 2023-09-03 DIAGNOSIS — H903 Sensorineural hearing loss, bilateral: Secondary | ICD-10-CM | POA: Diagnosis not present

## 2023-09-03 LAB — MICROSCOPIC EXAMINATION: Bacteria, UA: NONE SEEN

## 2023-09-03 LAB — URINALYSIS, COMPLETE
Bilirubin, UA: NEGATIVE
Glucose, UA: NEGATIVE
Ketones, UA: NEGATIVE
Leukocytes,UA: NEGATIVE
Nitrite, UA: NEGATIVE
Protein,UA: NEGATIVE
RBC, UA: NEGATIVE
Specific Gravity, UA: 1.015 (ref 1.005–1.030)
Urobilinogen, Ur: 0.2 mg/dL (ref 0.2–1.0)
pH, UA: 7 (ref 5.0–7.5)

## 2023-09-03 LAB — BLADDER SCAN AMB NON-IMAGING: Scan Result: 0
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
## 2023-09-03 NOTE — Patient Instructions (Signed)
Prostate MRI Prep:  1- No ejaculation 48 hours prior to exam  2- No caffeine or carbonated beverages on day of the exam  3- Eat light diet evening prior and day of exam  4- Avoid eating 4 hours prior to exam  5- Fleets enema needs to be done 4 hours prior to exam -See below. Can be purchased at the drug store.

## 2023-09-09 ENCOUNTER — Other Ambulatory Visit: Payer: BC Managed Care – PPO

## 2023-09-11 ENCOUNTER — Ambulatory Visit
Admission: RE | Admit: 2023-09-11 | Discharge: 2023-09-11 | Disposition: A | Discharge status: Home / Self Care | Payer: BC Managed Care – PPO | Source: Ambulatory Visit | Attending: Urology | Admitting: Urology

## 2023-09-11 DIAGNOSIS — N401 Enlarged prostate with lower urinary tract symptoms: Secondary | ICD-10-CM | POA: Insufficient documentation

## 2023-09-11 DIAGNOSIS — N529 Male erectile dysfunction, unspecified: Secondary | ICD-10-CM | POA: Diagnosis not present

## 2023-09-11 DIAGNOSIS — R972 Elevated prostate specific antigen [PSA]: Secondary | ICD-10-CM | POA: Diagnosis not present

## 2023-09-11 DIAGNOSIS — N323 Diverticulum of bladder: Secondary | ICD-10-CM | POA: Diagnosis not present

## 2023-09-11 DIAGNOSIS — H903 Sensorineural hearing loss, bilateral: Secondary | ICD-10-CM | POA: Diagnosis not present

## 2023-09-11 DIAGNOSIS — N400 Enlarged prostate without lower urinary tract symptoms: Secondary | ICD-10-CM | POA: Diagnosis not present

## 2023-09-11 MED ORDER — GADOBUTROL 1 MMOL/ML IV SOLN
10.0000 mL | Freq: Once | INTRAVENOUS | Status: AC | PRN
  Administered 2023-09-11: 10 mL via INTRAVENOUS

## 2023-09-12 ENCOUNTER — Telehealth: Payer: Self-pay | Admitting: Urology

## 2023-09-12 MED ORDER — TADALAFIL 20 MG PO TABS: 20.0000 mg | ORAL_TABLET | Freq: Every day | ORAL | 11 refills | Status: DC | PRN

## 2023-09-12 NOTE — Telephone Encounter (Signed)
Tadalafil 20 mg as needed sent in to [facility]. Patient advised.

## 2023-09-12 NOTE — Telephone Encounter (Signed)
Pt would like Tadalifil to pharmacy per Dr Delana Meyer last note.

## 2023-09-24 ENCOUNTER — Other Ambulatory Visit: Payer: Self-pay

## 2023-09-24 MED ORDER — IRBESARTAN 300 MG PO TABS: 300.0000 mg | ORAL_TABLET | Freq: Every day | ORAL | 0 refills | Status: DC

## 2023-10-08 ENCOUNTER — Other Ambulatory Visit: Payer: Self-pay

## 2023-10-08 MED ORDER — AMLODIPINE BESYLATE 10 MG PO TABS: 10.0000 mg | ORAL_TABLET | Freq: Every day | ORAL | 3 refills | Status: DC

## 2023-10-25 DIAGNOSIS — M7541 Impingement syndrome of right shoulder: Secondary | ICD-10-CM | POA: Diagnosis not present

## 2023-10-25 DIAGNOSIS — M7542 Impingement syndrome of left shoulder: Secondary | ICD-10-CM | POA: Diagnosis not present

## 2023-10-28 ENCOUNTER — Ambulatory Visit: Payer: BC Managed Care – PPO | Admitting: Family Medicine

## 2023-10-28 ENCOUNTER — Encounter: Payer: Self-pay | Admitting: Family Medicine

## 2023-10-28 VITALS — BP 136/73 | HR 80 | Resp 18 | Ht 72.0 in | Wt 236.8 lb

## 2023-10-28 DIAGNOSIS — E559 Vitamin D deficiency, unspecified: Secondary | ICD-10-CM | POA: Diagnosis not present

## 2023-10-28 DIAGNOSIS — E039 Hypothyroidism, unspecified: Secondary | ICD-10-CM

## 2023-10-28 DIAGNOSIS — I10 Essential (primary) hypertension: Secondary | ICD-10-CM | POA: Diagnosis not present

## 2023-10-28 DIAGNOSIS — Z23 Encounter for immunization: Secondary | ICD-10-CM | POA: Diagnosis not present

## 2023-10-28 DIAGNOSIS — Z0001 Encounter for general adult medical examination with abnormal findings: Secondary | ICD-10-CM

## 2023-10-28 DIAGNOSIS — R972 Elevated prostate specific antigen [PSA]: Secondary | ICD-10-CM

## 2023-10-28 DIAGNOSIS — Z0000 Encounter for general adult medical examination without abnormal findings: Secondary | ICD-10-CM

## 2023-10-30 ENCOUNTER — Telehealth: Payer: Self-pay | Admitting: Urology

## 2023-10-30 NOTE — Telephone Encounter (Signed)
Ok to change to 90 day supply

## 2023-10-30 NOTE — Telephone Encounter (Signed)
 Pt would like more than 30 days of Tadafil sent to [facility]

## 2023-10-31 MED ORDER — TADALAFIL 20 MG PO TABS: 20.0000 mg | ORAL_TABLET | Freq: Every day | ORAL | 3 refills | Status: DC | PRN

## 2023-10-31 NOTE — Telephone Encounter (Signed)
Patient advised and RX sent in. 

## 2023-11-04 ENCOUNTER — Encounter: Payer: Self-pay | Admitting: Family Medicine

## 2023-11-04 DIAGNOSIS — Z974 Presence of external hearing-aid: Secondary | ICD-10-CM | POA: Insufficient documentation
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
## 2023-11-06 ENCOUNTER — Ambulatory Visit: Payer: BC Managed Care – PPO | Admitting: Urology

## 2023-11-08 DIAGNOSIS — I10 Essential (primary) hypertension: Secondary | ICD-10-CM | POA: Diagnosis not present

## 2023-11-08 DIAGNOSIS — R972 Elevated prostate specific antigen [PSA]: Secondary | ICD-10-CM | POA: Diagnosis not present

## 2023-11-08 DIAGNOSIS — E559 Vitamin D deficiency, unspecified: Secondary | ICD-10-CM | POA: Diagnosis not present

## 2023-11-08 DIAGNOSIS — E039 Hypothyroidism, unspecified: Secondary | ICD-10-CM | POA: Diagnosis not present

## 2023-11-09 LAB — COMPREHENSIVE METABOLIC PANEL
ALT: 18 [IU]/L (ref 0–44)
AST: 19 [IU]/L (ref 0–40)
Albumin: 4.1 g/dL (ref 3.9–4.9)
Alkaline Phosphatase: 51 [IU]/L (ref 44–121)
BUN/Creatinine Ratio: 22 (ref 10–24)
BUN: 24 mg/dL (ref 8–27)
Bilirubin Total: 0.4 mg/dL (ref 0.0–1.2)
CO2: 20 mmol/L (ref 20–29)
Calcium: 9.5 mg/dL (ref 8.6–10.2)
Chloride: 102 mmol/L (ref 96–106)
Creatinine, Ser: 1.09 mg/dL (ref 0.76–1.27)
Globulin, Total: 2.4 g/dL (ref 1.5–4.5)
Glucose: 99 mg/dL (ref 70–99)
Potassium: 4.3 mmol/L (ref 3.5–5.2)
Sodium: 139 mmol/L (ref 134–144)
Total Protein: 6.5 g/dL (ref 6.0–8.5)
eGFR: 74 mL/min/{1.73_m2} (ref >59)

## 2023-11-09 LAB — LIPID PANEL
Chol/HDL Ratio: 2.9 {ratio} (ref 0.0–5.0)
Cholesterol, Total: 168 mg/dL (ref 100–199)
HDL: 58 mg/dL (ref >39)
LDL Chol Calc (NIH): 93 mg/dL (ref 0–99)
Triglycerides: 90 mg/dL (ref 0–149)
VLDL Cholesterol Cal: 17 mg/dL (ref 5–40)

## 2023-11-09 LAB — T4, FREE: Free T4: 1.53 ng/dL (ref 0.82–1.77)

## 2023-11-09 LAB — CBC
Hematocrit: 42.7 % (ref 37.5–51.0)
Hemoglobin: 14.1 g/dL (ref 13.0–17.7)
MCH: 30.7 pg (ref 26.6–33.0)
MCHC: 33 g/dL (ref 31.5–35.7)
MCV: 93 fL (ref 79–97)
Platelets: 341 10*3/uL (ref 150–450)
RBC: 4.6 x10E6/uL (ref 4.14–5.80)
RDW: 12 % (ref 11.6–15.4)
WBC: 8.6 10*3/uL (ref 3.4–10.8)

## 2023-11-09 LAB — PSA TOTAL (REFLEX TO FREE): Prostate Specific Ag, Serum: 2.1 ng/mL (ref 0.0–4.0)

## 2023-11-09 LAB — VITAMIN D 25 HYDROXY (VIT D DEFICIENCY, FRACTURES): Vit D, 25-Hydroxy: 28.1 ng/mL — ABNORMAL LOW (ref 30.0–100.0)

## 2023-11-09 LAB — TSH: TSH: 13.8 u[IU]/mL — ABNORMAL HIGH (ref 0.450–4.500)

## 2023-11-10 ENCOUNTER — Other Ambulatory Visit: Payer: Self-pay | Admitting: Family Medicine

## 2023-11-10 ENCOUNTER — Encounter: Payer: Self-pay | Admitting: Family Medicine

## 2023-11-10 DIAGNOSIS — E039 Hypothyroidism, unspecified: Secondary | ICD-10-CM

## 2023-11-10 MED ORDER — LEVOTHYROXINE SODIUM 200 MCG PO TABS: ORAL_TABLET | ORAL | Status: DC

## 2023-11-23 ENCOUNTER — Other Ambulatory Visit: Payer: Self-pay | Admitting: Family Medicine

## 2023-11-25 NOTE — Telephone Encounter (Signed)
Requested Prescriptions  Pending Prescriptions Disp Refills   finasteride (PROSCAR) 5 MG tablet [Pharmacy Med Name: FINASTERIDE 5 MG TABLET] 90 tablet 1    Sig: TAKE 1 TABLET BY MOUTH DAILY     Urology: 5-alpha Reductase Inhibitors Passed - 11/25/2023  3:07 PM      Passed - PSA in normal range and within 360 days    PSA  Date Value Ref Range Status  08/20/2017 1.3 < OR = 4.0 ng/mL Final    Comment:    The total PSA value from this assay system is  standardized against the WHO standard. The test  result will be approximately 20% lower when compared  to the equimolar-standardized total PSA (Beckman  Coulter). Comparison of serial PSA results should be  interpreted with this fact in mind. . This test was performed using the Siemens  chemiluminescent method. Values obtained from  different assay methods cannot be used interchangeably. PSA levels, regardless of value, should not be interpreted as absolute evidence of the presence or absence of disease.   01/21/2012 2.2  Final   Prostate Specific Ag, Serum  Date Value Ref Range Status  11/08/2023 2.1 0.0 - 4.0 ng/mL Final    Comment:    Roche ECLIA methodology. According to the American Urological Association, Serum PSA should decrease and remain at undetectable levels after radical prostatectomy. The AUA defines biochemical recurrence as an initial PSA value 0.2 ng/mL or greater followed by a subsequent confirmatory PSA value 0.2 ng/mL or greater. Values obtained with different assay methods or kits cannot be used interchangeably. Results cannot be interpreted as absolute evidence of the presence or absence of malignant disease.          Passed - Valid encounter within last 12 months    Recent Outpatient Visits           4 weeks ago Primary hypertension   [place] [facility] Malva Limes, MD   7 months ago Other fatigue   [place] [facility] Malva Limes, MD   11 months ago  Annual physical exam   [facility] Malva Limes, MD   1 year ago Acute non-recurrent frontal sinusitis   [place] [facility] Alfredia Ferguson, PA-C   1 year ago Annual physical exam   [facility] Malva Limes, MD       Future Appointments             In 3 months [name], MD [facility]

## 2023-12-03 ENCOUNTER — Telehealth: Payer: Self-pay | Admitting: Family Medicine

## 2023-12-03 NOTE — Telephone Encounter (Signed)
Form was completed this morning and sent to medical records.

## 2023-12-03 NOTE — Telephone Encounter (Signed)
Sending back a Provider Screening form to be completed.   Please call patient when ready for pickup.

## 2023-12-03 NOTE — Telephone Encounter (Signed)
Called wife to let her know form was completed. She will pick up on 12/04/23.

## 2023-12-17 ENCOUNTER — Encounter: Payer: Self-pay | Admitting: Family Medicine

## 2023-12-17 ENCOUNTER — Other Ambulatory Visit: Payer: Self-pay | Admitting: Family Medicine

## 2023-12-17 DIAGNOSIS — E039 Hypothyroidism, unspecified: Secondary | ICD-10-CM

## 2024-01-03 DIAGNOSIS — E039 Hypothyroidism, unspecified: Secondary | ICD-10-CM | POA: Diagnosis not present

## 2024-01-04 ENCOUNTER — Encounter: Payer: Self-pay | Admitting: Family Medicine

## 2024-01-04 LAB — T4, FREE: Free T4: 1.86 ng/dL — ABNORMAL HIGH (ref 0.82–1.77)

## 2024-01-04 LAB — TSH: TSH: 1.87 u[IU]/mL (ref 0.450–4.500)

## 2024-01-14 ENCOUNTER — Telehealth: Payer: Self-pay | Admitting: Family Medicine

## 2024-01-14 NOTE — Telephone Encounter (Signed)
 [facility] faxed refill request for the following medications:   irbesartan (AVAPRO) 300 MG tablet    Please advise.

## 2024-01-19 ENCOUNTER — Other Ambulatory Visit: Payer: Self-pay | Admitting: Family Medicine

## 2024-01-19 DIAGNOSIS — E039 Hypothyroidism, unspecified: Secondary | ICD-10-CM

## 2024-01-20 ENCOUNTER — Telehealth: Payer: Self-pay | Admitting: Cardiology

## 2024-01-20 MED ORDER — IRBESARTAN 300 MG PO TABS: 300.0000 mg | ORAL_TABLET | Freq: Every day | ORAL | 1 refills | Status: DC

## 2024-01-20 NOTE — Telephone Encounter (Signed)
*  STAT* If patient is at the pharmacy, call can be transferred to refill team.   1. Which medications need to be refilled? (please list name of each medication and dose if known)  irbesartan (AVAPRO) 300 MG tablet   2. Which pharmacy/location (including street and city if local pharmacy) is medication to be sent to? [facility] [id number] - [place], [place] - [address]  3. Do they need a 30 day or 90 day supply?  90 day supply

## 2024-02-05 ENCOUNTER — Ambulatory Visit: Attending: Cardiology | Admitting: Cardiology

## 2024-02-05 ENCOUNTER — Encounter: Payer: Self-pay | Admitting: Cardiology

## 2024-02-05 VITALS — BP 120/68 | HR 80 | Ht 74.0 in | Wt 227.0 lb

## 2024-02-05 DIAGNOSIS — I10 Essential (primary) hypertension: Secondary | ICD-10-CM

## 2024-02-05 DIAGNOSIS — E7800 Pure hypercholesterolemia, unspecified: Secondary | ICD-10-CM

## 2024-02-05 NOTE — Progress Notes (Signed)
 Cardiology Office Note:  .   Date:  02/05/2024  ID:  Ronnie [name], DOB 1956-06-27, MRN [id number] PCP: Lamon Pillow, MD  [facility] Providers Cardiologist:  None    History of Present Illness: .   Ronnie [name] is a 68 y.o. male with past medical history of hypertension, hyperlipidemia, who presents today for follow-up on his hypertension.  He was last seen in clinic 10/29/2022 by Dr.Agbor-Etang.  Previously been seen for hypertension and his amlodipine was titrated to 10 mg daily.  He been tolerating his medications without issues and blood pressures improved remained stable.  He continued to deny any chest pain or shortness of breath.  Due to medication changes that were made for further testing that was ordered.  He returns to clinic today stating send well from a cardiac perspective.  He denies any chest pain, shortness of breath, peripheral edema.  Continues to remain active going to the gym 4-5 times per week.  Blood pressures have been better well-controlled.  He does note that he has been taking a beet juice that he believes that has helped his pressure as well as long as his medication.  States that he has been compliant with his medication regimen without any adverse side effects.  Denies any hospitalizations or visits to the emergency department.  ROS: 10 point review of systems were reviewed and considered negative except ones been listed in the HPI  Studies Reviewed: Aaron Aas   EKG Interpretation Date/Time:  Wednesday February 05 2024 15:41:40 EDT Ventricular Rate:  80 PR Interval:  176 QRS Duration:  98 QT Interval:  362 QTC Calculation: 417 R Axis:   -17  Text Interpretation: Normal sinus rhythm Normal ECG No previous ECGs available Confirmed by Ronald Cockayne ([id number]) on 02/05/2024 4:00:46 PM     Risk Assessment/Calculations:             Physical Exam:   VS:  BP 120/68 (BP Location: Left Arm)   Pulse 80   Ht 6\' 2"  (1.88 m)   Wt 227 lb (103 kg)   SpO2 93%    BMI 29.15 kg/m    Wt Readings from Last 3 Encounters:  02/05/24 227 lb (103 kg)  10/28/23 236 lb 12.8 oz (107.4 kg)  09/03/23 220 lb (99.8 kg)    GEN: Well nourished, well developed in no acute distress NECK: No JVD; No carotid bruits CARDIAC: RRR, no murmurs, rubs, gallops RESPIRATORY:  Clear to auscultation without rales, wheezing or rhonchi  ABDOMEN: Soft, non-tender, non-distended EXTREMITIES:  No edema; No deformity   ASSESSMENT AND PLAN: .   Primary hypertension with a blood pressure today 120/68.  Blood pressures remain stable.  He has been continued on amlodipine 10 mg daily and irbesartan 300 mg daily.  He has been encouraged to continue to monitor his blood pressure 1 to 2 hours postmedication administration as well.  Kidney function has remained stable on last labs.  EKG today reveals sinus rhythm with a rate of 80 with close axis deviation without acute change or concern for ischemia.  Mixed hyperlipidemia with an LDL of 93 which continues to improve with dietary changes.  Is also continued on pravastatin 80 mg daily.  Ongoing management per PCP.  His LDL has an upward trend and can consider changing pravastatin to atorvastatin.       Dispo: Patient to return to clinic to see MD/APP in 11 to 12 months or sooner if needed for further evaluation  Signed, Axiel Fjeld  Michille Mcelrath, NP

## 2024-02-05 NOTE — Patient Instructions (Signed)
 Medication Instructions:  Your physician recommends that you continue on your current medications as directed. Please refer to the Current Medication list given to you today.  *If you need a refill on your cardiac medications before your next appointment, please call your pharmacy*  Lab Work: No labs ordered today  If you have labs (blood work) drawn today and your tests are completely normal, you will receive your results only by: MyChart Message (if you have MyChart) OR A paper copy in the mail If you have any lab test that is abnormal or we need to change your treatment, we will call you to review the results.  Testing/Procedures: No test ordered today   Follow-Up: At [facility], you and your health needs are our priority.  As part of our continuing mission to provide you with exceptional heart care, our providers are all part of one team.  This team includes your primary Cardiologist (physician) and Advanced Practice Providers or APPs (Physician Assistants and Nurse Practitioners) who all work together to provide you with the care you need, when you need it.  Your next appointment:   1 year(s)  Provider:   You may see  or one of the following Advanced Practice Providers on your designated Care Team:   Laneta Pintos, NP Gildardo Labrador, PA-C Varney Gentleman, PA-C Cadence [place], PA-C Ronald Cockayne, NP Morey Ar, NP

## 2024-02-15 ENCOUNTER — Other Ambulatory Visit: Payer: Self-pay | Admitting: Family Medicine

## 2024-02-15 DIAGNOSIS — E7800 Pure hypercholesterolemia, unspecified: Secondary | ICD-10-CM

## 2024-02-19 DIAGNOSIS — H43813 Vitreous degeneration, bilateral: Secondary | ICD-10-CM | POA: Diagnosis not present

## 2024-02-19 DIAGNOSIS — H2513 Age-related nuclear cataract, bilateral: Secondary | ICD-10-CM | POA: Diagnosis not present

## 2024-03-04 ENCOUNTER — Ambulatory Visit: Payer: Self-pay | Admitting: Urology

## 2024-03-04 VITALS — BP 143/86 | HR 82

## 2024-03-04 DIAGNOSIS — N529 Male erectile dysfunction, unspecified: Secondary | ICD-10-CM

## 2024-03-04 DIAGNOSIS — R972 Elevated prostate specific antigen [PSA]: Secondary | ICD-10-CM

## 2024-03-04 DIAGNOSIS — N401 Enlarged prostate with lower urinary tract symptoms: Secondary | ICD-10-CM | POA: Diagnosis not present

## 2024-03-04 LAB — BLADDER SCAN AMB NON-IMAGING: Scan Result: 102
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
## 2024-03-10 ENCOUNTER — Telehealth: Payer: Self-pay

## 2024-03-10 DIAGNOSIS — N529 Male erectile dysfunction, unspecified: Secondary | ICD-10-CM

## 2024-03-10 MED ORDER — SILDENAFIL CITRATE 50 MG PO TABS: 50.0000 mg | ORAL_TABLET | Freq: Every day | ORAL | 11 refills | Status: AC | PRN

## 2024-03-10 NOTE — Telephone Encounter (Signed)
 Incoming call from pt on triage line who states he needs a script sent in for sildenafil, per last note RX needs to be sent for 50mg  tablets. RX sent. Pt encouraged to use goodrx.

## 2024-03-13 DIAGNOSIS — M7542 Impingement syndrome of left shoulder: Secondary | ICD-10-CM | POA: Diagnosis not present

## 2024-03-13 DIAGNOSIS — M7541 Impingement syndrome of right shoulder: Secondary | ICD-10-CM | POA: Diagnosis not present

## 2024-04-05 DIAGNOSIS — B9689 Other specified bacterial agents as the cause of diseases classified elsewhere: Secondary | ICD-10-CM | POA: Diagnosis not present

## 2024-04-05 DIAGNOSIS — J019 Acute sinusitis, unspecified: Secondary | ICD-10-CM | POA: Diagnosis not present

## 2024-04-05 DIAGNOSIS — R059 Cough, unspecified: Secondary | ICD-10-CM | POA: Diagnosis not present

## 2024-04-05 DIAGNOSIS — R0981 Nasal congestion: Secondary | ICD-10-CM | POA: Diagnosis not present

## 2024-05-10 ENCOUNTER — Other Ambulatory Visit: Payer: Self-pay | Admitting: Family Medicine

## 2024-05-19 ENCOUNTER — Other Ambulatory Visit: Payer: Self-pay | Admitting: Family Medicine

## 2024-07-01 DIAGNOSIS — M7542 Impingement syndrome of left shoulder: Secondary | ICD-10-CM | POA: Diagnosis not present

## 2024-07-01 DIAGNOSIS — M7541 Impingement syndrome of right shoulder: Secondary | ICD-10-CM | POA: Diagnosis not present

## 2024-07-13 ENCOUNTER — Other Ambulatory Visit: Payer: Self-pay | Admitting: Family Medicine

## 2024-07-13 ENCOUNTER — Other Ambulatory Visit: Payer: Self-pay

## 2024-07-13 DIAGNOSIS — E039 Hypothyroidism, unspecified: Secondary | ICD-10-CM

## 2024-07-14 MED ORDER — IRBESARTAN 300 MG PO TABS: 300.0000 mg | ORAL_TABLET | Freq: Every day | ORAL | 1 refills | Status: AC

## 2024-08-03 ENCOUNTER — Other Ambulatory Visit: Payer: Self-pay | Admitting: Family Medicine

## 2024-08-10 ENCOUNTER — Other Ambulatory Visit: Payer: Self-pay | Admitting: Family Medicine

## 2024-08-10 DIAGNOSIS — E039 Hypothyroidism, unspecified: Secondary | ICD-10-CM

## 2024-08-20 DIAGNOSIS — Z872 Personal history of diseases of the skin and subcutaneous tissue: Secondary | ICD-10-CM | POA: Diagnosis not present

## 2024-08-20 DIAGNOSIS — L570 Actinic keratosis: Secondary | ICD-10-CM | POA: Diagnosis not present

## 2024-08-20 DIAGNOSIS — L578 Other skin changes due to chronic exposure to nonionizing radiation: Secondary | ICD-10-CM | POA: Diagnosis not present

## 2024-08-20 DIAGNOSIS — Z86018 Personal history of other benign neoplasm: Secondary | ICD-10-CM | POA: Diagnosis not present

## 2024-08-24 ENCOUNTER — Ambulatory Visit: Admitting: Family Medicine

## 2024-08-24 ENCOUNTER — Encounter: Payer: Self-pay | Admitting: Family Medicine

## 2024-08-24 VITALS — BP 135/84 | HR 88 | Temp 97.4°F | Resp 16 | Wt 230.6 lb

## 2024-08-24 DIAGNOSIS — Z0000 Encounter for general adult medical examination without abnormal findings: Secondary | ICD-10-CM | POA: Diagnosis not present

## 2024-08-24 DIAGNOSIS — E039 Hypothyroidism, unspecified: Secondary | ICD-10-CM | POA: Diagnosis not present

## 2024-08-24 DIAGNOSIS — E559 Vitamin D deficiency, unspecified: Secondary | ICD-10-CM

## 2024-08-24 DIAGNOSIS — E7800 Pure hypercholesterolemia, unspecified: Secondary | ICD-10-CM

## 2024-08-24 DIAGNOSIS — Z860101 Personal history of adenomatous and serrated colon polyps: Secondary | ICD-10-CM

## 2024-08-24 DIAGNOSIS — I10 Essential (primary) hypertension: Secondary | ICD-10-CM

## 2024-08-28 DIAGNOSIS — E7800 Pure hypercholesterolemia, unspecified: Secondary | ICD-10-CM | POA: Diagnosis not present

## 2024-08-28 DIAGNOSIS — E039 Hypothyroidism, unspecified: Secondary | ICD-10-CM | POA: Diagnosis not present

## 2024-08-28 DIAGNOSIS — E559 Vitamin D deficiency, unspecified: Secondary | ICD-10-CM | POA: Diagnosis not present

## 2024-08-29 LAB — CBC
Hematocrit: 47.3 % (ref 37.5–51.0)
Hemoglobin: 15.2 g/dL (ref 13.0–17.7)
MCH: 29.6 pg (ref 26.6–33.0)
MCHC: 32.1 g/dL (ref 31.5–35.7)
MCV: 92 fL (ref 79–97)
Platelets: 331 x10E3/uL (ref 150–450)
RBC: 5.14 x10E6/uL (ref 4.14–5.80)
RDW: 12.8 % (ref 11.6–15.4)
WBC: 5.4 x10E3/uL (ref 3.4–10.8)

## 2024-08-29 LAB — COMPREHENSIVE METABOLIC PANEL WITH GFR
ALT: 18 IU/L (ref 0–44)
AST: 24 IU/L (ref 0–40)
Albumin: 4.5 g/dL (ref 3.9–4.9)
Alkaline Phosphatase: 57 IU/L (ref 47–123)
BUN/Creatinine Ratio: 17 (ref 10–24)
BUN: 19 mg/dL (ref 8–27)
Bilirubin Total: 0.4 mg/dL (ref 0.0–1.2)
CO2: 24 mmol/L (ref 20–29)
Calcium: 10.4 mg/dL — ABNORMAL HIGH (ref 8.6–10.2)
Chloride: 104 mmol/L (ref 96–106)
Creatinine, Ser: 1.13 mg/dL (ref 0.76–1.27)
Globulin, Total: 2.7 g/dL (ref 1.5–4.5)
Glucose: 103 mg/dL — ABNORMAL HIGH (ref 70–99)
Potassium: 5.1 mmol/L (ref 3.5–5.2)
Sodium: 140 mmol/L (ref 134–144)
Total Protein: 7.2 g/dL (ref 6.0–8.5)
eGFR: 70 mL/min/1.73 (ref >59)

## 2024-08-29 LAB — LIPID PANEL
Chol/HDL Ratio: 4.6 ratio (ref 0.0–5.0)
Cholesterol, Total: 217 mg/dL — ABNORMAL HIGH (ref 100–199)
HDL: 47 mg/dL (ref >39)
LDL Chol Calc (NIH): 148 mg/dL — ABNORMAL HIGH (ref 0–99)
Triglycerides: 123 mg/dL (ref 0–149)
VLDL Cholesterol Cal: 22 mg/dL (ref 5–40)

## 2024-08-29 LAB — TSH+FREE T4
Free T4: 1.99 ng/dL — ABNORMAL HIGH (ref 0.82–1.77)
TSH: 1.3 u[IU]/mL (ref 0.450–4.500)

## 2024-08-29 LAB — VITAMIN D 25 HYDROXY (VIT D DEFICIENCY, FRACTURES): Vit D, 25-Hydroxy: 51.1 ng/mL (ref 30.0–100.0)

## 2024-08-30 ENCOUNTER — Ambulatory Visit: Payer: Self-pay | Admitting: Family Medicine

## 2024-09-07 ENCOUNTER — Other Ambulatory Visit: Payer: Self-pay | Admitting: Family Medicine

## 2024-09-07 DIAGNOSIS — E039 Hypothyroidism, unspecified: Secondary | ICD-10-CM
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
## 2024-09-18 ENCOUNTER — Telehealth: Payer: Self-pay

## 2024-09-20 NOTE — Telephone Encounter (Signed)
 I do not  know what this message is in regards to.

## 2024-09-30 DIAGNOSIS — M7542 Impingement syndrome of left shoulder: Secondary | ICD-10-CM | POA: Diagnosis not present

## 2024-09-30 DIAGNOSIS — M7541 Impingement syndrome of right shoulder: Secondary | ICD-10-CM | POA: Diagnosis not present

## 2024-10-02 ENCOUNTER — Other Ambulatory Visit: Payer: Self-pay | Admitting: Cardiology

## 2024-10-12 ENCOUNTER — Other Ambulatory Visit: Payer: Self-pay

## 2024-10-12 MED ORDER — TADALAFIL 20 MG PO TABS: 20.0000 mg | ORAL_TABLET | Freq: Every day | ORAL | 1 refills | Status: AC | PRN

## 2025-02-24 ENCOUNTER — Other Ambulatory Visit

## 2025-03-02 ENCOUNTER — Ambulatory Visit: Admitting: Physician Assistant
